# Patient Record
Sex: Female | Born: 1990 | Race: White | Hispanic: No | Marital: Single | Smoking: Current every day smoker
Health system: Southern US, Community
[De-identification: ages and names within clinical notes are randomized; demographics above are authoritative.]

## PROBLEM LIST (undated history)

## (undated) DIAGNOSIS — F1910 Other psychoactive substance abuse, uncomplicated: Secondary | ICD-10-CM

## (undated) DIAGNOSIS — Z87440 Personal history of urinary (tract) infections: Secondary | ICD-10-CM

## (undated) DIAGNOSIS — N61 Mastitis without abscess: Secondary | ICD-10-CM

## (undated) DIAGNOSIS — N289 Disorder of kidney and ureter, unspecified: Secondary | ICD-10-CM

## (undated) DIAGNOSIS — N200 Calculus of kidney: Secondary | ICD-10-CM

## (undated) HISTORY — PX: TOOTH EXTRACTION: SUR596

---
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
## 2011-12-20 ENCOUNTER — Emergency Department (HOSPITAL_COMMUNITY)
Admission: EM | Admit: 2011-12-20 | Discharge: 2011-12-20 | Disposition: A | Discharge status: Home / Self Care | Payer: Medicaid Other | Attending: Emergency Medicine | Admitting: Emergency Medicine

## 2011-12-20 ENCOUNTER — Emergency Department (HOSPITAL_COMMUNITY): Payer: Medicaid Other

## 2011-12-20 ENCOUNTER — Encounter (HOSPITAL_COMMUNITY): Payer: Self-pay | Admitting: Emergency Medicine

## 2011-12-20 DIAGNOSIS — N949 Unspecified condition associated with female genital organs and menstrual cycle: Secondary | ICD-10-CM | POA: Insufficient documentation

## 2011-12-20 DIAGNOSIS — M549 Dorsalgia, unspecified: Secondary | ICD-10-CM | POA: Insufficient documentation

## 2011-12-20 DIAGNOSIS — R109 Unspecified abdominal pain: Secondary | ICD-10-CM | POA: Insufficient documentation

## 2011-12-20 DIAGNOSIS — R112 Nausea with vomiting, unspecified: Secondary | ICD-10-CM | POA: Insufficient documentation

## 2011-12-20 DIAGNOSIS — R3915 Urgency of urination: Secondary | ICD-10-CM | POA: Insufficient documentation

## 2011-12-20 DIAGNOSIS — R300 Dysuria: Secondary | ICD-10-CM | POA: Insufficient documentation

## 2011-12-20 DIAGNOSIS — IMO0002 Reserved for concepts with insufficient information to code with codable children: Secondary | ICD-10-CM | POA: Insufficient documentation

## 2011-12-20 HISTORY — DX: Personal history of urinary (tract) infections: Z87.440

## 2011-12-20 HISTORY — DX: Mastitis without abscess: N61.0

## 2011-12-20 LAB — DIFFERENTIAL
Basophils Absolute: 0 10*3/uL (ref 0.0–0.1)
Basophils Relative: 0 % (ref 0–1)
Eosinophils Relative: 0 % (ref 0–5)
Lymphocytes Relative: 4 % — ABNORMAL LOW (ref 12–46)
Monocytes Absolute: 0.4 10*3/uL (ref 0.1–1.0)

## 2011-12-20 LAB — URINALYSIS, ROUTINE W REFLEX MICROSCOPIC
Bilirubin Urine: NEGATIVE
Ketones, ur: NEGATIVE mg/dL
Leukocytes, UA: NEGATIVE
Nitrite: NEGATIVE
Protein, ur: NEGATIVE mg/dL
Urobilinogen, UA: 0.2 mg/dL (ref 0.0–1.0)
pH: 5.5 (ref 5.0–8.0)

## 2011-12-20 LAB — WET PREP, GENITAL
Trich, Wet Prep: NONE SEEN
WBC, Wet Prep HPF POC: NONE SEEN
Yeast Wet Prep HPF POC: NONE SEEN

## 2011-12-20 LAB — COMPREHENSIVE METABOLIC PANEL
AST: 17 U/L (ref 0–37)
Albumin: 3.8 g/dL (ref 3.5–5.2)
BUN: 10 mg/dL (ref 6–23)
Calcium: 9.3 mg/dL (ref 8.4–10.5)
Creatinine, Ser: 0.6 mg/dL (ref 0.50–1.10)
Total Bilirubin: 0.5 mg/dL (ref 0.3–1.2)
Total Protein: 6.8 g/dL (ref 6.0–8.3)

## 2011-12-20 LAB — CBC
HCT: 41.6 % (ref 36.0–46.0)
MCHC: 33.7 g/dL (ref 30.0–36.0)
MCV: 93.1 fL (ref 78.0–100.0)
Platelets: 251 10*3/uL (ref 150–400)
RDW: 12.5 % (ref 11.5–15.5)
WBC: 13.3 10*3/uL — ABNORMAL HIGH (ref 4.0–10.5)

## 2011-12-20 LAB — PREGNANCY, URINE: Preg Test, Ur: NEGATIVE

## 2011-12-20 MED ORDER — ONDANSETRON HCL 4 MG/2ML IJ SOLN
4.0000 mg | Freq: Once | INTRAMUSCULAR | Status: AC
  Administered 2011-12-20: 4 mg via INTRAVENOUS
  Filled 2011-12-20 (×2): qty 2

## 2011-12-20 MED ORDER — IOHEXOL 300 MG/ML  SOLN
80.0000 mL | Freq: Once | INTRAMUSCULAR | Status: AC | PRN
  Administered 2011-12-20: 80 mL via INTRAVENOUS

## 2011-12-20 MED ORDER — DIPHENHYDRAMINE HCL 50 MG/ML IJ SOLN: 25.0000 mg | Freq: Once | INTRAMUSCULAR | Status: DC

## 2011-12-20 MED ORDER — HYDROCODONE-ACETAMINOPHEN 5-325 MG PO TABS: 1.0000 | ORAL_TABLET | ORAL | Status: AC | PRN

## 2011-12-20 MED ORDER — HYDROMORPHONE HCL PF 1 MG/ML IJ SOLN
1.0000 mg | Freq: Once | INTRAMUSCULAR | Status: AC
  Administered 2011-12-20: 1 mg via INTRAVENOUS
  Filled 2011-12-20: qty 1

## 2011-12-20 NOTE — ED Provider Notes (Signed)
History     CSN: [id number]  Arrival date & time 12/20/11  1721   First MD Initiated Contact with Patient 12/20/11 1836      Chief Complaint  Patient presents with  . Abdominal Pain    (Consider location/radiation/quality/duration/timing/severity/associated sxs/prior treatment) Patient is a 21 y.o. female presenting with abdominal pain. The history is provided by the patient. No language interpreter was used.  Abdominal Pain The primary symptoms of the illness include abdominal pain, nausea, vomiting and dysuria. The primary symptoms of the illness do not include fever, shortness of breath, diarrhea, hematemesis, vaginal discharge or vaginal bleeding. The current episode started more than 2 days ago. The onset of the illness was gradual. The problem has not changed since onset. The dysuria is associated with urgency. The dysuria is not associated with hematuria or frequency.  The patient states that she believes she is currently not pregnant. The patient has not had a change in bowel habit. Additional symptoms associated with the illness include urgency and back pain. Symptoms associated with the illness do not include chills, diaphoresis, constipation, hematuria or frequency.  c/o lower abdominal pain x 3 weeks with dysuria and back pain.   N/v x 2 days.  Denies fever, vaginal bleeding or discharge.  Pain with intercourse.    Past Medical History  Diagnosis Date  . H/O bladder infections   . Mastitis     Past Surgical History  Procedure Date  . Cesarean section     No family history on file.  History  Substance Use Topics  . Smoking status: Current Everyday Smoker  . Smokeless tobacco: Not on file  . Alcohol Use: No    OB History    Grav Para Term Preterm Abortions TAB SAB Ect Mult Living                  Review of Systems  Constitutional: Negative.  Negative for fever, chills and diaphoresis.  HENT: Negative.   Eyes: Negative.   Respiratory: Negative.  Negative  for shortness of breath.   Cardiovascular: Negative.   Gastrointestinal: Positive for nausea, vomiting and abdominal pain. Negative for diarrhea, constipation and hematemesis.  Genitourinary: Positive for dysuria and urgency. Negative for frequency, hematuria, vaginal bleeding, vaginal discharge, enuresis and difficulty urinating.  Musculoskeletal: Positive for back pain.  Neurological: Negative.  Negative for dizziness, weakness and light-headedness.  Psychiatric/Behavioral: Negative.   All other systems reviewed and are negative.    Allergies  Aspirin  Home Medications  No current outpatient prescriptions on file.  BP 115/66  Pulse 121  Temp(Src) 99.5 F (37.5 C) (Oral)  Resp 18  SpO2 98%  LMP 12/11/2011  Physical Exam  Nursing note and vitals reviewed. Constitutional: She is oriented to person, place, and time. She appears well-developed and well-nourished.  HENT:  Head: Normocephalic and atraumatic.  Eyes: Conjunctivae and EOM are normal. Pupils are equal, round, and reactive to light.  Neck: Normal range of motion. Neck supple.  Cardiovascular: Normal rate.   Pulmonary/Chest: Effort normal.  Abdominal: Soft.  Genitourinary: Uterus normal. Cervix exhibits no motion tenderness, no discharge and no friability. Right adnexum displays no mass and no tenderness. Left adnexum displays no mass and no tenderness. There is tenderness around the vagina. No erythema or bleeding around the vagina. No vaginal discharge found.  Musculoskeletal: Normal range of motion. She exhibits no edema and no tenderness.  Neurological: She is alert and oriented to person, place, and time. She has normal reflexes.  Skin:  Skin is warm and dry.  Psychiatric: She has a normal mood and affect.    ED Course  Procedures (including critical care time)  Labs Reviewed  CBC - Abnormal; Notable for the following:    WBC 13.3 (*)    All other components within normal limits  DIFFERENTIAL - Abnormal;  Notable for the following:    Neutrophils Relative 92 (*)    Neutro Abs 12.3 (*)    Lymphocytes Relative 4 (*)    Lymphs Abs 0.6 (*)    All other components within normal limits  COMPREHENSIVE METABOLIC PANEL - Abnormal; Notable for the following:    Potassium 3.3 (*)    Glucose, Bld 111 (*)    All other components within normal limits  URINALYSIS, ROUTINE W REFLEX MICROSCOPIC  PREGNANCY, URINE  WET PREP, GENITAL  GC/CHLAMYDIA PROBE AMP, GENITAL   No results found.   No diagnosis found.    MDM  Report given to Theron Arista PA to dispo patient when CT results come back.  3 weeks of lower abdominal pain with dysuria.  U/a, wet prep and labs unremarkable.  WBC 13.5.  Needs to get pcp or gyn to follow up with this week.    Labs Reviewed  CBC - Abnormal; Notable for the following:    WBC 13.3 (*)    All other components within normal limits  DIFFERENTIAL - Abnormal; Notable for the following:    Neutrophils Relative 92 (*)    Neutro Abs 12.3 (*)    Lymphocytes Relative 4 (*)    Lymphs Abs 0.6 (*)    All other components within normal limits  COMPREHENSIVE METABOLIC PANEL - Abnormal; Notable for the following:    Potassium 3.3 (*)    Glucose, Bld 111 (*)    All other components within normal limits  URINALYSIS, ROUTINE W REFLEX MICROSCOPIC  PREGNANCY, URINE  WET PREP, GENITAL  GC/CHLAMYDIA PROBE AMP, GENITAL  URINE CULTURE         Remi Haggard, NP 12/20/11 2122

## 2011-12-20 NOTE — Discharge Instructions (Signed)
Ms Frogge the pelvic exam did not show any abnormal findings today.  There are still labs pending for gonorrhea and Chlamydia testing.  You will be called if they are positive.  The CT of your abdomen did not show any concerning or emergent findings to explain your pain today. There was no signs of appendicitis or other inflammation or infections. At this time your providers feel you may return home and followup with a primary care provider. Return to emergency room for any worsening pain, fever, chills, persistent nausea vomiting.   Abdominal Pain Abdominal pain can be caused by many things. Your caregiver decides the seriousness of your pain by an examination and possibly blood tests and X-rays. Many cases can be observed and treated at home. Most abdominal pain is not caused by a disease and will probably improve without treatment. However, in many cases, more time must pass before a clear cause of the pain can be found. Before that point, it may not be known if you need more testing, or if hospitalization or surgery is needed. HOME CARE INSTRUCTIONS   Do not take laxatives unless directed by your caregiver.   Take pain medicine only as directed by your caregiver.   Only take over-the-counter or prescription medicines for pain, discomfort, or fever as directed by your caregiver.   Try a clear liquid diet (broth, tea, or water) for as long as directed by your caregiver. Slowly move to a bland diet as tolerated.  SEEK IMMEDIATE MEDICAL CARE IF:   The pain does not go away.   You have a fever.   You keep throwing up (vomiting).   The pain is felt only in portions of the abdomen. Pain in the right side could possibly be appendicitis. In an adult, pain in the left lower portion of the abdomen could be colitis or diverticulitis.   You pass bloody or black tarry stools.  MAKE SURE YOU:   Understand these instructions.   Will watch your condition.   Will get help right away if you are not  doing well or get worse.  Document Released: 04/20/2005 Document Revised: 06/30/2011 Document Reviewed: 02/27/2008 [facility] Patient Information 2012 [place], [place].   RESOURCE GUIDE  Dental Problems  Patients with Medicaid: [facility] [phone] [address]                                           [phone] [address] Phone:  [phone]                                                  Phone:  [phone]  If unable to pay or uninsured, contact:  [facility] or [facility]. to become qualified for the adult dental clinic.  Chronic Pain Problems Contact [facility]  [phone] Patients need to be referred by their primary care doctor.  Insufficient Money for Medicine Contact United Way:  call "211" or [facility] [phone].  No Primary Care Doctor Call [facility]  [phone] Other agencies that provide inexpensive medical care    [facility]  Family Medicine  [phone]    [facility]  [phone]    [facility]  [phone]    [facility]  [phone]    [facility]  [phone]    [facility]  [phone]  Psychological Services [facility]  [phone] [facility]  [phone] [facility] Mental [facility]   [phone] (emergency services [phone])  Substance Abuse Resources [facility]  [phone] [facility] [phone] [phone] [facility] [phone] Floydene Flock ([phone] Residential & Outpatient Substance Abuse Program  [phone]  Abuse/Neglect [facility] Child Abuse Hotline (320)189-0645 [facility] Child Abuse Hotline (352)651-1224 (After Hours)  Emergency Shelter [facility] [phone]  Maternity Homes [facility] [place] [facility] [phone] [facility] [phone]  MRSA Hotline #:   (239)628-3389    [facility]  Resources  [facility]     [employer]                          [facility] Dept. [address] [place]                       [address]      [address]  Blondell Reveal Phone:  [phone]                                   Phone:  [phone]                 Phone:  [phone]  [facility] Phone:  [phone]  [facility] Child Abuse Hotline 5205215313 [phone] (After Hours)

## 2011-12-20 NOTE — ED Notes (Signed)
Pt. With lower right and left quadrant abdominal pain and back pain.  Pain started two weeks ago, but has become progressively worse in the last two days.  Pt. Also having nausea and vomiting the past two days.  Vomited twice.  Pt. Is currently nauseated.  Pt. Has burning with urination and cloudy urine.  History of bladder infections.

## 2011-12-20 NOTE — ED Notes (Signed)
ZOX:[id number]<AV> Expected date:12/20/11<BR> Expected time:<BR> Means of arrival:<BR> Comments:<BR> EMS 61 GC - abd pain

## 2011-12-20 NOTE — ED Provider Notes (Signed)
Jenny Reed S 8:30 PM patient discussed in sign out with Remi Haggard NP. Patient presenting with several weeks of dysuria and abdominal pains. Patient had unremarkable UA and pelvic exam with wet prep. Patient sent for CT for further evaluation. CT pending.   CT scan unremarkable for any concerning or emergent causes of patient's pain. There was small amount of incidental air in the bladder. I discussed this with radiologist. There was no signs for cystovaginal or rectalovaginal fistula. The air may have been a result from the pelvic examination but not thought to be a source of patient's symptoms today.  I discussed findings with patient. Patient is still concerned about her dysuria and urinary problems. Will send urine for culture. Patient also told about pending GC Chlamydia tests.    Angus Seller, [place] 12/20/11 2111

## 2011-12-21 LAB — GC/CHLAMYDIA PROBE AMP, GENITAL: GC Probe Amp, Genital: NEGATIVE

## 2011-12-21 NOTE — ED Provider Notes (Signed)
Medical screening examination/treatment/procedure(s) were performed by non-physician practitioner and as supervising physician I was immediately available for consultation/collaboration.  Aneri Slagel T Jailynn Lavalais, MD 12/21/11 2321 

## 2011-12-21 NOTE — ED Provider Notes (Signed)
Medical screening examination/treatment/procedure(s) were performed by non-physician practitioner and as supervising physician I was immediately available for consultation/collaboration.  Adrianah Prophete T Angelli Baruch, MD 12/21/11 2321 

## 2011-12-23 LAB — URINE CULTURE

## 2011-12-24 NOTE — ED Notes (Signed)
+   Urine Chart sent to EDP office for review. 

## 2011-12-25 NOTE — ED Notes (Signed)
Chart reviewed by Trixie Dredge PA "Give Bactrim DS 1 po BID x 3 days.  F/U w/PCP next week as planned" 6/2 [facility] # message states pt not accepting calls @ this time.

## 2011-12-31 NOTE — ED Notes (Signed)
Sent patient letter after no answer x4.

## 2012-01-03 NOTE — ED Notes (Signed)
Prescription called in to [facility] in [place] at [phone] for bactrim ds one po bid for 3 days, no refills.

## 2013-03-08 ENCOUNTER — Emergency Department (HOSPITAL_COMMUNITY): Payer: Self-pay

## 2013-03-08 ENCOUNTER — Emergency Department (HOSPITAL_COMMUNITY)
Admission: EM | Admit: 2013-03-08 | Discharge: 2013-03-08 | Disposition: A | Discharge status: Home / Self Care | Payer: Self-pay | Attending: Emergency Medicine | Admitting: Emergency Medicine

## 2013-03-08 ENCOUNTER — Encounter (HOSPITAL_COMMUNITY): Payer: Self-pay | Admitting: Emergency Medicine

## 2013-03-08 DIAGNOSIS — T8389XA Other specified complication of genitourinary prosthetic devices, implants and grafts, initial encounter: Secondary | ICD-10-CM | POA: Insufficient documentation

## 2013-03-08 DIAGNOSIS — Z87440 Personal history of urinary (tract) infections: Secondary | ICD-10-CM | POA: Insufficient documentation

## 2013-03-08 DIAGNOSIS — R319 Hematuria, unspecified: Secondary | ICD-10-CM | POA: Insufficient documentation

## 2013-03-08 DIAGNOSIS — N200 Calculus of kidney: Secondary | ICD-10-CM | POA: Insufficient documentation

## 2013-03-08 DIAGNOSIS — R110 Nausea: Secondary | ICD-10-CM | POA: Insufficient documentation

## 2013-03-08 DIAGNOSIS — Z960 Presence of urogenital implants: Secondary | ICD-10-CM

## 2013-03-08 DIAGNOSIS — R300 Dysuria: Secondary | ICD-10-CM | POA: Insufficient documentation

## 2013-03-08 DIAGNOSIS — Y846 Urinary catheterization as the cause of abnormal reaction of the patient, or of later complication, without mention of misadventure at the time of the procedure: Secondary | ICD-10-CM | POA: Insufficient documentation

## 2013-03-08 DIAGNOSIS — F17200 Nicotine dependence, unspecified, uncomplicated: Secondary | ICD-10-CM | POA: Insufficient documentation

## 2013-03-08 DIAGNOSIS — Z3202 Encounter for pregnancy test, result negative: Secondary | ICD-10-CM | POA: Insufficient documentation

## 2013-03-08 DIAGNOSIS — Z87448 Personal history of other diseases of urinary system: Secondary | ICD-10-CM | POA: Insufficient documentation

## 2013-03-08 LAB — COMPREHENSIVE METABOLIC PANEL
ALT: 23 U/L (ref 0–35)
AST: 26 U/L (ref 0–37)
Alkaline Phosphatase: 45 U/L (ref 39–117)
CO2: 25 mEq/L (ref 19–32)
Calcium: 9.1 mg/dL (ref 8.4–10.5)
Potassium: 3.4 mEq/L — ABNORMAL LOW (ref 3.5–5.1)
Sodium: 140 mEq/L (ref 135–145)
Total Protein: 5.8 g/dL — ABNORMAL LOW (ref 6.0–8.3)

## 2013-03-08 LAB — URINALYSIS, ROUTINE W REFLEX MICROSCOPIC
Bilirubin Urine: NEGATIVE
Ketones, ur: NEGATIVE mg/dL
Specific Gravity, Urine: 1.017 (ref 1.005–1.030)
pH: 6.5 (ref 5.0–8.0)

## 2013-03-08 LAB — URINE MICROSCOPIC-ADD ON

## 2013-03-08 LAB — CBC
Hemoglobin: 12.6 g/dL (ref 12.0–15.0)
MCH: 31.5 pg (ref 26.0–34.0)
MCHC: 34.7 g/dL (ref 30.0–36.0)
Platelets: 361 10*3/uL (ref 150–400)
RBC: 4 MIL/uL (ref 3.87–5.11)

## 2013-03-08 MED ORDER — NAPROXEN 500 MG PO TABS: 500.0000 mg | ORAL_TABLET | Freq: Two times a day (BID) | ORAL | Status: DC

## 2013-03-08 MED ORDER — ONDANSETRON HCL 4 MG PO TABS: 4.0000 mg | ORAL_TABLET | Freq: Four times a day (QID) | ORAL | Status: DC

## 2013-03-08 MED ORDER — MORPHINE SULFATE 4 MG/ML IJ SOLN
4.0000 mg | Freq: Once | INTRAMUSCULAR | Status: AC
  Administered 2013-03-08: 4 mg via INTRAVENOUS
  Filled 2013-03-08: qty 1

## 2013-03-08 MED ORDER — ONDANSETRON HCL 4 MG/2ML IJ SOLN
4.0000 mg | Freq: Once | INTRAMUSCULAR | Status: AC
  Administered 2013-03-08: 4 mg via INTRAVENOUS
  Filled 2013-03-08: qty 2

## 2013-03-08 MED ORDER — OXYCODONE-ACETAMINOPHEN 5-325 MG PO TABS: 1.0000 | ORAL_TABLET | ORAL | Status: DC | PRN

## 2013-03-08 MED ORDER — CIPROFLOXACIN IN D5W 400 MG/200ML IV SOLN
400.0000 mg | Freq: Once | INTRAVENOUS | Status: AC
  Administered 2013-03-08: 400 mg via INTRAVENOUS
  Filled 2013-03-08: qty 200

## 2013-03-08 NOTE — ED Notes (Addendum)
Pt c/o right lower abdomen pain. Pt was dx with kidney stone on 02/24/13 and had a stent placed. About 2 days after stent was placed pt has been having increased pain, went back to ED and they were unable to remove stent at the time when she went and was sent home with rx for ABT and nausea medication. Pt was unable to fill med.

## 2013-03-08 NOTE — ED Provider Notes (Signed)
CSN: [id number]     Arrival date & time 03/08/13  4098 History     First MD Initiated Contact with Patient 03/08/13 0940     Chief Complaint  Patient presents with  . Abdominal Pain   (Consider location/radiation/quality/duration/timing/severity/associated sxs/prior Treatment) HPI Comments: 22 year old white female presents emergency department with chief complaint of right flank pain. Patient initially presented to [facility] in [place] for right flank pain and was diagnosed with a kidney stone. Her past medical records were reviewed and revealed she had ESWL and right ureteral stent placed on 02/24/2013. Per the patient and her mother she was supposed to have the stent removed in one week however she did not have Medicaid in [place] and was told she had to pay for this procedure and opted not to have it done. She was seen in emergency department insert soda on August 12 was complaining nausea and vomiting and was discharged with prescription for Cipro and Phenergan which she did not fill. The patient's mother went to [place] and brought her daughter home. She was recently seen at [facility] in [place] one day ago on 03/07/2013.  Mother and daughter report that "the left the emergency department because they had to wait too long and nothing was being done"..  Patient's current symptoms include right flank pain. The patient and her mother requesting removal of the right ureteral stent. Of note the patient states she can no longer find the strings for the stent. Patient denies fever, chills, chest pain, shortness of breath, vomiting, or vaginal symptoms. She does report right flank and right abdominal pain and nausea without vomiting. She states her last menstrual period was one month ago and denies pregnancy. She still has hematuria and dysuria.  Patient is a 22 y.o. female presenting with flank pain.  Flank Pain This is a chronic problem. The current episode started more than  1 week ago. The problem occurs constantly. The problem has not changed since onset.Associated symptoms include abdominal pain. Pertinent negatives include no chest pain, no headaches and no shortness of breath. The symptoms are aggravated by bending and twisting. Nothing relieves the symptoms. She has tried nothing for the symptoms.    Past Medical History  Diagnosis Date  . H/O bladder infections   . Mastitis    Past Surgical History  Procedure Laterality Date  . Cesarean section     No family history on file. History  Substance Use Topics  . Smoking status: Current Every Day Smoker  . Smokeless tobacco: Not on file  . Alcohol Use: No   OB History   Grav Para Term Preterm Abortions TAB SAB Ect Mult Living                 Review of Systems  Constitutional: Negative.   HENT: Negative.   Eyes: Negative.   Respiratory: Negative.  Negative for shortness of breath.   Cardiovascular: Negative for chest pain.  Gastrointestinal: Positive for abdominal pain.  Endocrine: Negative.   Genitourinary: Positive for dysuria, hematuria and flank pain. Negative for frequency, enuresis, difficulty urinating, genital sores, menstrual problem, pelvic pain and dyspareunia.  Neurological: Negative.  Negative for dizziness, facial asymmetry and headaches.    Allergies  Aspirin and Bactrim  Home Medications   Current Outpatient Rx  Name  Route  Sig  Dispense  Refill  . HYDROcodone-acetaminophen (NORCO/VICODIN) 5-325 MG per tablet   Oral   Take 1 tablet by mouth every 4 (four) hours as needed for pain.         Marland Kitchen  oxyCODONE-acetaminophen (PERCOCET) 10-325 MG per tablet   Oral   Take 1 tablet by mouth every 4 (four) hours as needed for pain.         . naproxen (NAPROSYN) 500 MG tablet   Oral   Take 1 tablet (500 mg total) by mouth 2 (two) times daily.   30 tablet   0   . ondansetron (ZOFRAN) 4 MG tablet   Oral   Take 1 tablet (4 mg total) by mouth every 6 (six) hours.   12 tablet    0   . oxyCODONE-acetaminophen (PERCOCET/ROXICET) 5-325 MG per tablet   Oral   Take 1 tablet by mouth every 4 (four) hours as needed for pain.   20 tablet   0    BP 111/74  Pulse 78  Temp(Src) 98.1 F (36.7 C) (Oral)  SpO2 99%  LMP 02/13/2013 Physical Exam  Constitutional: She appears well-developed and well-nourished. No distress.  HENT:  Head: Normocephalic and atraumatic.  Eyes: Conjunctivae and EOM are normal. Pupils are equal, round, and reactive to light.  Neck: Normal range of motion. Neck supple.  Cardiovascular: Normal rate, regular rhythm and normal heart sounds.   Pulmonary/Chest: Effort normal and breath sounds normal.  Abdominal: Soft. She exhibits no mass. There is tenderness. There is no rebound and no guarding.  Right flank tenderness to palpation and CVA tenderness present; abdomen otherwise soft, no guarding rigidity or masses, no hepatosplenomegaly  Genitourinary:       ED Course   Procedures (including critical care time)  Labs Reviewed  COMPREHENSIVE METABOLIC PANEL - Abnormal; Notable for the following:    Potassium 3.4 (*)    Total Protein 5.8 (*)    Albumin 3.4 (*)    All other components within normal limits  URINALYSIS, ROUTINE W REFLEX MICROSCOPIC - Abnormal; Notable for the following:    Color, Urine RED (*)    APPearance TURBID (*)    Hgb urine dipstick LARGE (*)    Protein, ur >300 (*)    Leukocytes, UA LARGE (*)    All other components within normal limits  URINE MICROSCOPIC-ADD ON - Abnormal; Notable for the following:    Squamous Epithelial / LPF MANY (*)    Bacteria, UA FEW (*)    All other components within normal limits  URINE CULTURE  CBC  PREGNANCY, URINE   Dg Abd 1 View  03/08/2013   *RADIOLOGY REPORT*  Clinical Data: evaluate position of right ureteral stent, placed two weeks ago for kidney stone  ABDOMEN - 1 VIEW  Comparison: None.  Findings: Double loop right ureteral stent present, with proximal loop to the right of  the L2/3 disk space and distal loop over the anticipated position of the bladder.  Normal bowel gas pattern.  IMPRESSION: Ureteral stent as described, with proximal loop over the anticipated region of the right ureteropelvic junction and distal loop over the anticipated position of the bladder.   Original Report Authenticated By: Esperanza Heir, M.D.   1. Right kidney stone   2. Ureteral stent retained     Results for orders placed during the hospital encounter of 03/08/13  CBC      Result Value Range   WBC 10.1  4.0 - 10.5 K/uL   RBC 4.00  3.87 - 5.11 MIL/uL   Hemoglobin 12.6  12.0 - 15.0 g/dL   HCT 95.6  21.3 - 08.6 %   MCV 90.8  78.0 - 100.0 fL   MCH 31.5  26.0 -  34.0 pg   MCHC 34.7  30.0 - 36.0 g/dL   RDW 09.8  11.9 - 14.7 %   Platelets 361  150 - 400 K/uL  COMPREHENSIVE METABOLIC PANEL      Result Value Range   Sodium 140  135 - 145 mEq/L   Potassium 3.4 (*) 3.5 - 5.1 mEq/L   Chloride 106  96 - 112 mEq/L   CO2 25  19 - 32 mEq/L   Glucose, Bld 86  70 - 99 mg/dL   BUN 10  6 - 23 mg/dL   Creatinine, Ser 8.29  0.50 - 1.10 mg/dL   Calcium 9.1  8.4 - 56.2 mg/dL   Total Protein 5.8 (*) 6.0 - 8.3 g/dL   Albumin 3.4 (*) 3.5 - 5.2 g/dL   AST 26  0 - 37 U/L   ALT 23  0 - 35 U/L   Alkaline Phosphatase 45  39 - 117 U/L   Total Bilirubin 0.5  0.3 - 1.2 mg/dL   GFR calc non Af Amer >90  >90 mL/min   GFR calc Af Amer >90  >90 mL/min  URINALYSIS, ROUTINE W REFLEX MICROSCOPIC      Result Value Range   Color, Urine RED (*) YELLOW   APPearance TURBID (*) CLEAR   Specific Gravity, Urine 1.017  1.005 - 1.030   pH 6.5  5.0 - 8.0   Glucose, UA NEGATIVE  NEGATIVE mg/dL   Hgb urine dipstick LARGE (*) NEGATIVE   Bilirubin Urine NEGATIVE  NEGATIVE   Ketones, ur NEGATIVE  NEGATIVE mg/dL   Protein, ur >130 (*) NEGATIVE mg/dL   Urobilinogen, UA 1.0  0.0 - 1.0 mg/dL   Nitrite NEGATIVE  NEGATIVE   Leukocytes, UA LARGE (*) NEGATIVE  PREGNANCY, URINE      Result Value Range   Preg Test, Ur  NEGATIVE  NEGATIVE  URINE MICROSCOPIC-ADD ON      Result Value Range   Squamous Epithelial / LPF MANY (*) RARE   WBC, UA 3-6  <3 WBC/hpf   RBC / HPF TOO NUMEROUS TO COUNT  <3 RBC/hpf   Bacteria, UA FEW (*) RARE    EDC: VSS, labs ok, no evidence or UTI or SBI, symptoms managed in ER.    MDM  22 year old white female with history of right-sided kidney stone status post ESWL and ureteral stent placement on 02/24/2013. Patient has had minimal followup and poor compliance with her suggested plan. Per report the stent was supposed to be removed 7 days after placement but still remains in place. She has not been able to pay for her medications nor removal of the stent. Today she is in minimal distress, her vital signs are normal, she is afebrile.  Plan to obtain urine, blood, KUB plain film. Upon obtaining data I will discuss case with urology.  Case d/w Urology, Dr. Crecencio Mc who agrees with outpt follow up in the urology clinic.  Pt is to call for f/u.    Darlys Gales, MD 03/08/13 [phone]

## 2013-03-09 LAB — URINE CULTURE

## 2013-03-14 ENCOUNTER — Encounter (HOSPITAL_COMMUNITY): Payer: Self-pay | Admitting: *Deleted

## 2013-03-14 ENCOUNTER — Emergency Department (HOSPITAL_COMMUNITY)
Admission: EM | Admit: 2013-03-14 | Discharge: 2013-03-14 | Disposition: A | Discharge status: Home / Self Care | Payer: Self-pay | Attending: Emergency Medicine | Admitting: Emergency Medicine

## 2013-03-14 DIAGNOSIS — R319 Hematuria, unspecified: Secondary | ICD-10-CM | POA: Insufficient documentation

## 2013-03-14 DIAGNOSIS — F17200 Nicotine dependence, unspecified, uncomplicated: Secondary | ICD-10-CM | POA: Insufficient documentation

## 2013-03-14 DIAGNOSIS — Z466 Encounter for fitting and adjustment of urinary device: Secondary | ICD-10-CM | POA: Insufficient documentation

## 2013-03-14 DIAGNOSIS — Z3202 Encounter for pregnancy test, result negative: Secondary | ICD-10-CM | POA: Insufficient documentation

## 2013-03-14 DIAGNOSIS — R630 Anorexia: Secondary | ICD-10-CM | POA: Insufficient documentation

## 2013-03-14 DIAGNOSIS — Z87440 Personal history of urinary (tract) infections: Secondary | ICD-10-CM | POA: Insufficient documentation

## 2013-03-14 DIAGNOSIS — M545 Low back pain, unspecified: Secondary | ICD-10-CM | POA: Insufficient documentation

## 2013-03-14 DIAGNOSIS — Z87448 Personal history of other diseases of urinary system: Secondary | ICD-10-CM | POA: Insufficient documentation

## 2013-03-14 DIAGNOSIS — Z87442 Personal history of urinary calculi: Secondary | ICD-10-CM | POA: Insufficient documentation

## 2013-03-14 DIAGNOSIS — R300 Dysuria: Secondary | ICD-10-CM | POA: Insufficient documentation

## 2013-03-14 DIAGNOSIS — R112 Nausea with vomiting, unspecified: Secondary | ICD-10-CM | POA: Insufficient documentation

## 2013-03-14 DIAGNOSIS — R000 Tachycardia, unspecified: Secondary | ICD-10-CM | POA: Insufficient documentation

## 2013-03-14 DIAGNOSIS — Z960 Presence of urogenital implants: Secondary | ICD-10-CM

## 2013-03-14 DIAGNOSIS — R3915 Urgency of urination: Secondary | ICD-10-CM | POA: Insufficient documentation

## 2013-03-14 HISTORY — DX: Disorder of kidney and ureter, unspecified: N28.9

## 2013-03-14 LAB — URINALYSIS, ROUTINE W REFLEX MICROSCOPIC
Nitrite: NEGATIVE
Specific Gravity, Urine: 1.02 (ref 1.005–1.030)
pH: 6.5 (ref 5.0–8.0)

## 2013-03-14 LAB — CBC WITH DIFFERENTIAL/PLATELET
Basophils Absolute: 0 10*3/uL (ref 0.0–0.1)
Basophils Relative: 0 % (ref 0–1)
Eosinophils Absolute: 0.1 10*3/uL (ref 0.0–0.7)
Eosinophils Relative: 1 % (ref 0–5)
Lymphocytes Relative: 23 % (ref 12–46)
MCH: 32.2 pg (ref 26.0–34.0)
MCHC: 34.8 g/dL (ref 30.0–36.0)
MCV: 92.3 fL (ref 78.0–100.0)
Platelets: 448 10*3/uL — ABNORMAL HIGH (ref 150–400)
RDW: 13.4 % (ref 11.5–15.5)
WBC: 7.9 10*3/uL (ref 4.0–10.5)

## 2013-03-14 LAB — URINE MICROSCOPIC-ADD ON

## 2013-03-14 LAB — COMPREHENSIVE METABOLIC PANEL
ALT: 26 U/L (ref 0–35)
AST: 42 U/L — ABNORMAL HIGH (ref 0–37)
Calcium: 10.1 mg/dL (ref 8.4–10.5)
Sodium: 138 mEq/L (ref 135–145)
Total Protein: 7.2 g/dL (ref 6.0–8.3)

## 2013-03-14 MED ORDER — ONDANSETRON HCL 4 MG/2ML IJ SOLN
4.0000 mg | Freq: Once | INTRAMUSCULAR | Status: AC
  Administered 2013-03-14: 4 mg via INTRAVENOUS
  Filled 2013-03-14: qty 2

## 2013-03-14 MED ORDER — HYDROMORPHONE HCL PF 1 MG/ML IJ SOLN
1.0000 mg | Freq: Once | INTRAMUSCULAR | Status: AC
  Administered 2013-03-14: 1 mg via INTRAVENOUS
  Filled 2013-03-14: qty 1

## 2013-03-14 MED ORDER — PROMETHAZINE HCL 25 MG PO TABS: 25.0000 mg | ORAL_TABLET | Freq: Four times a day (QID) | ORAL | Status: DC | PRN

## 2013-03-14 MED ORDER — DEXTROSE 5 % IV SOLN
1.0000 g | Freq: Once | INTRAVENOUS | Status: AC
  Administered 2013-03-14: 1 g via INTRAVENOUS
  Filled 2013-03-14: qty 10

## 2013-03-14 MED ORDER — OXYCODONE-ACETAMINOPHEN 5-325 MG PO TABS: 1.0000 | ORAL_TABLET | ORAL | Status: DC | PRN

## 2013-03-14 MED ORDER — SODIUM CHLORIDE 0.9 % IV BOLUS (SEPSIS)
1000.0000 mL | Freq: Once | INTRAVENOUS | Status: AC
  Administered 2013-03-14: 1000 mL via INTRAVENOUS

## 2013-03-14 NOTE — ED Notes (Signed)
Per pt and family, has hx of kidney stone and had renal stent placed, having increase in abd pain and has been seen here for same and told that she needs to have stent removed but has been unable to schedule a followup with [facility].

## 2013-03-14 NOTE — ED Notes (Signed)
Plan of care is updated with verbal understanding. Pt reports decrease in pain at this time and will continue to monitor pt.

## 2013-03-14 NOTE — ED Provider Notes (Signed)
CSN: [id number]     Arrival date & time 03/14/13  1704 History     First MD Initiated Contact with Patient 03/14/13 1841     Chief Complaint  Patient presents with  . Abdominal Pain   (Consider location/radiation/quality/duration/timing/severity/associated sxs/prior Treatment) Patient is a 22 y.o. female presenting with abdominal pain. The history is provided by the patient and medical records. No language interpreter was used.  Abdominal Pain Associated symptoms: dysuria   Associated symptoms: no chest pain, no constipation, no cough, no diarrhea, no fatigue, no fever, no hematuria, no nausea, no shortness of breath and no vomiting     LOUANNA VANLIEW is a 22 y.o. female  with a hx of bladder infections and kidney stones presents to the Emergency Department complaining of persistent, unrelenting suprapubic and low back pain.  Patient initially presented to the [facility], diagnosed with kidney stone and had a right ureteral stent placed on 02/24/2013. She was supposed to have the stent removed in one week however because she did not have Medicaid it was not done. Patient presented back to the emergency department on 03/05/2013 were she was prescribed Cipro and Phenergan which she did not fill. Patient then came home to [place] on 03/06/2013 and was seen at [facility] on 03/07/2013. Patient was evaluated here in the emergency department on 03/08/2013 and was not found to have a urinary tract infection. Dr. Laverle Patter was consult and he recommended outpatient followup with the removal. Patient was given Cipro here in the emergency department and discharged home with pain medications.  She presents today with continuation of her suprapubic and low back pain. Mother states patient has been afebrile but has had decreased appetite nausea and vomiting. She also endorses significant dysuria and hematuria. Patient reports that the pain medication did not help her pain and that she could  not afford to fill her nausea medicine. Pt denies chest pain, shortness of breath, diarrhea, weakness, dizziness, syncope.     Past Medical History  Diagnosis Date  . H/O bladder infections   . Mastitis   . Renal disorder    Past Surgical History  Procedure Laterality Date  . Cesarean section     History reviewed. No pertinent family history. History  Substance Use Topics  . Smoking status: Current Every Day Smoker  . Smokeless tobacco: Not on file  . Alcohol Use: No   OB History   Grav Para Term Preterm Abortions TAB SAB Ect Mult Living                 Review of Systems  Constitutional: Positive for appetite change. Negative for fever, diaphoresis, fatigue and unexpected weight change.  HENT: Negative for mouth sores and neck stiffness.   Eyes: Negative for visual disturbance.  Respiratory: Negative for cough, chest tightness, shortness of breath and wheezing.   Cardiovascular: Negative for chest pain.  Gastrointestinal: Positive for abdominal pain. Negative for nausea, vomiting, diarrhea and constipation.  Endocrine: Negative for polydipsia, polyphagia and polyuria.  Genitourinary: Positive for dysuria, urgency and flank pain. Negative for frequency and hematuria.  Musculoskeletal: Negative for back pain.  Skin: Negative for rash.  Allergic/Immunologic: Negative for immunocompromised state.  Neurological: Negative for syncope, light-headedness and headaches.  Hematological: Does not bruise/bleed easily.  Psychiatric/Behavioral: Negative for sleep disturbance. The patient is not nervous/anxious.     Allergies  Aspirin and Bactrim  Home Medications   Current Outpatient Rx  Name  Route  Sig  Dispense  Refill  .  Doxylamine Succinate, Sleep, (SLEEP AID PO)   Oral   Take 1 tablet by mouth at bedtime as needed (for sleep).         . naproxen (NAPROSYN) 500 MG tablet   Oral   Take 1 tablet (500 mg total) by mouth 2 (two) times daily.   30 tablet   0    BP  134/85  Pulse 101  Temp(Src) 98.6 F (37 C) (Oral)  Resp 18  SpO2 98%  LMP 02/13/2013 Physical Exam  Nursing note and vitals reviewed. Constitutional: She is oriented to person, place, and time. She appears well-developed and well-nourished.  Is alert, awake, nontoxic, nonseptic appearing  HENT:  Head: Normocephalic and atraumatic.  Mouth/Throat: Oropharynx is clear and moist.  Eyes: Conjunctivae are normal. Pupils are equal, round, and reactive to light. No scleral icterus.  Neck: Normal range of motion.  Cardiovascular: Regular rhythm, S1 normal, S2 normal, normal heart sounds and intact distal pulses.  Tachycardia present.   No murmur heard. Pulses:      Radial pulses are 2+ on the right side, and 2+ on the left side.       Dorsalis pedis pulses are 2+ on the right side, and 2+ on the left side.       Posterior tibial pulses are 2+ on the right side, and 2+ on the left side.  Mild Tachycardia  Pulmonary/Chest: Effort normal and breath sounds normal. No respiratory distress. She has no wheezes.  Abdominal: Soft. Bowel sounds are normal. She exhibits no distension and no mass. There is tenderness in the suprapubic area. There is CVA tenderness (bilateral). There is no rebound and no guarding.  Patient with suprapubic pain to palpation without guarding  Lymphadenopathy:    She has no cervical adenopathy.  Neurological: She is alert and oriented to person, place, and time. She exhibits normal muscle tone. Coordination normal.  Skin: Skin is warm and dry. No rash noted. No erythema.  Psychiatric: She has a normal mood and affect.  Patient tearful    ED Course   Procedures (including critical care time)  Labs Reviewed  CBC WITH DIFFERENTIAL - Abnormal; Notable for the following:    Platelets 448 (*)    All other components within normal limits  COMPREHENSIVE METABOLIC PANEL - Abnormal; Notable for the following:    AST 42 (*)    All other components within normal limits   URINALYSIS, ROUTINE W REFLEX MICROSCOPIC - Abnormal; Notable for the following:    Color, Urine AMBER (*)    APPearance CLOUDY (*)    Hgb urine dipstick LARGE (*)    Bilirubin Urine SMALL (*)    Ketones, ur >80 (*)    Protein, ur >300 (*)    Urobilinogen, UA 2.0 (*)    Leukocytes, UA LARGE (*)    All other components within normal limits  URINE MICROSCOPIC-ADD ON - Abnormal; Notable for the following:    Squamous Epithelial / LPF FEW (*)    Bacteria, UA FEW (*)    All other components within normal limits  URINE CULTURE  POCT PREGNANCY, URINE   No results found. 1. Ureteral stent retained     MDM  Judee Clara presents with persistent suprapubic and low back pain post EWSL and ureteral stent placement 02/24/2013. Patient has not had any follow-up; has not had the stent removed in his been poorly compliant with plan of action in her medications.  Patient relates that she has not been seen by  [facility] because they need $150 to be seen.  Record review shows that exam on 03/08/2013 showed no strings at the urethra.  Will give fluids, pain control and nausea control here in the emergency department.  Discussed with Dr. Vernie Ammons who states he would not give antibiotics if the patient is not febrile with a leukocytosis. He also states that she must followup in the office for removal of the stent. He specifically states he will not come to the emergency department to remove her stent.  Discussed with patient and family.  She will receive 2 L of fluid here along with Rocephin, pain and nausea control. She is to followup with urology this week.  Discussed with Kyung Bacca, PA-C who will follow and discharge home.    Dr. Loren Racer was consulted, evaluated this patient with me and agrees with the plan.     Dahlia Client Fran Mcree, PA-C 03/14/13 2011

## 2013-03-14 NOTE — ED Notes (Signed)
Pt denies any questions upon discharge. 

## 2013-03-15 LAB — URINE CULTURE
Colony Count: NO GROWTH
Culture: NO GROWTH

## 2013-03-17 NOTE — ED Provider Notes (Signed)
Medical screening examination/treatment/procedure(s) were conducted as a shared visit with non-physician practitioner(s) and myself.  I personally evaluated the patient during the encounter Pt with retained ureteral stent. Discussed findings with Urology. No abx recommended. Outpatient follow up for removal. Conveyed to pt and mother. Will f/u  Loren Racer, MD 03/17/13 [phone]

## 2013-06-21 ENCOUNTER — Emergency Department (HOSPITAL_COMMUNITY)
Admission: EM | Admit: 2013-06-21 | Discharge: 2013-06-21 | Disposition: A | Discharge status: Home / Self Care | Payer: Medicaid Other | Attending: Emergency Medicine | Admitting: Emergency Medicine

## 2013-06-21 ENCOUNTER — Encounter (HOSPITAL_COMMUNITY): Payer: Self-pay | Admitting: Emergency Medicine

## 2013-06-21 DIAGNOSIS — Z87448 Personal history of other diseases of urinary system: Secondary | ICD-10-CM | POA: Insufficient documentation

## 2013-06-21 DIAGNOSIS — F17200 Nicotine dependence, unspecified, uncomplicated: Secondary | ICD-10-CM | POA: Insufficient documentation

## 2013-06-21 DIAGNOSIS — K089 Disorder of teeth and supporting structures, unspecified: Secondary | ICD-10-CM | POA: Insufficient documentation

## 2013-06-21 DIAGNOSIS — Z8742 Personal history of other diseases of the female genital tract: Secondary | ICD-10-CM | POA: Insufficient documentation

## 2013-06-21 DIAGNOSIS — K029 Dental caries, unspecified: Secondary | ICD-10-CM | POA: Insufficient documentation

## 2013-06-21 DIAGNOSIS — K0889 Other specified disorders of teeth and supporting structures: Secondary | ICD-10-CM

## 2013-06-21 MED ORDER — IBUPROFEN 800 MG PO TABS: 800.0000 mg | ORAL_TABLET | Freq: Three times a day (TID) | ORAL | Status: DC

## 2013-06-21 NOTE — ED Provider Notes (Signed)
CSN: [id number]     Arrival date & time 06/21/13  1322 History  This chart was scribed for non-physician practitioner Sharilyn Sites, PA-C working with Flint Melter, MD by Danella Maiers, ED Scribe. This patient was seen in room TR09C/TR09C and the patient's care was started at 1:38 PM.   Chief Complaint  Patient presents with  . Dental Pain   The history is provided by the patient. No language interpreter was used.   HPI Comments: Jenny Reed is a 22 y.o. female who presents to the Emergency Department complaining of constant right lower tooth pain since a piece of her tooth broke off yesterday while she was eating candy.  Denies any numbness or paresthesias of face.  Pt not currently established with a dentist but states she has had that tooth filled in the past.   Past Medical History  Diagnosis Date  . H/O bladder infections   . Mastitis   . Renal disorder    Past Surgical History  Procedure Laterality Date  . Cesarean section     History reviewed. No pertinent family history. History  Substance Use Topics  . Smoking status: Current Every Day Smoker  . Smokeless tobacco: Not on file  . Alcohol Use: Yes     Comment: occ   OB History   Grav Para Term Preterm Abortions TAB SAB Ect Mult Living                 Review of Systems  HENT: Positive for dental problem.   All other systems reviewed and are negative.    Allergies  Aspirin and Bactrim  Home Medications   Current Outpatient Rx  Name  Route  Sig  Dispense  Refill  . Doxylamine Succinate, Sleep, (SLEEP AID PO)   Oral   Take 1 tablet by mouth at bedtime as needed (for sleep).         . naproxen (NAPROSYN) 500 MG tablet   Oral   Take 1 tablet (500 mg total) by mouth 2 (two) times daily.   30 tablet   0   . oxyCODONE-acetaminophen (PERCOCET/ROXICET) 5-325 MG per tablet   Oral   Take 1-2 tablets by mouth every 4 (four) hours as needed for pain.   30 tablet   0   . promethazine (PHENERGAN) 25  MG tablet   Oral   Take 1 tablet (25 mg total) by mouth every 6 (six) hours as needed for nausea.   30 tablet   0    BP 127/82  Pulse 80  Temp(Src) 96.9 F (36.1 C) (Oral)  Resp 18  SpO2 100% Physical Exam  Nursing note and vitals reviewed. Constitutional: She is oriented to person, place, and time. She appears well-developed and well-nourished. No distress.  HENT:  Head: Normocephalic and atraumatic.  Mouth/Throat: Uvula is midline, oropharynx is clear and moist and mucous membranes are normal. Abnormal dentition. Dental caries present. No dental abscesses.    Teeth largely in poor dentition, right lower molar broken, surrounding gingiva normal in appearance; handling secretions appropriately, no trismus  Eyes: EOM are normal.  Neck: Neck supple. No tracheal deviation present.  Cardiovascular: Normal rate.   Pulmonary/Chest: Effort normal. No respiratory distress.  Musculoskeletal: Normal range of motion.  Neurological: She is alert and oriented to person, place, and time.  Skin: Skin is warm and dry.  Psychiatric: She has a normal mood and affect. Her behavior is normal.    ED Course  Procedures (including critical care  time) Medications - No data to display  DIAGNOSTIC STUDIES: Oxygen Saturation is 100% on RA, normal by my interpretation.    COORDINATION OF CARE: 1:52 PM- Discussed treatment plan with pt which includes dentist referral. Pt agrees to plan.   MDM   1. Pain, dental     Dental pain due to broken tooth without signs of abscess. Rx motrin.  FU with dentist-- referral given. Discussed plan with pt, they agreed.  Return precautions advised.  I personally performed the services described in this documentation, which was scribed in my presence. The recorded information has been reviewed and is accurate.  Garlon Hatchet, PA-C 06/21/13 1430

## 2013-06-21 NOTE — ED Provider Notes (Signed)
Medical screening examination/treatment/procedure(s) were performed by non-physician practitioner and as supervising physician I was immediately available for consultation/collaboration.  Torrance Stockley L Biagio Snelson, MD 06/21/13 1703 

## 2013-06-21 NOTE — ED Notes (Signed)
Pt c/o right lower tooth pain after breaking piece of tooth of yesterday

## 2013-06-27 ENCOUNTER — Encounter (HOSPITAL_COMMUNITY): Payer: Self-pay | Admitting: Emergency Medicine

## 2013-06-27 ENCOUNTER — Emergency Department (HOSPITAL_COMMUNITY)
Admission: EM | Admit: 2013-06-27 | Discharge: 2013-06-27 | Disposition: A | Discharge status: Home / Self Care | Payer: Medicaid Other | Attending: Emergency Medicine | Admitting: Emergency Medicine

## 2013-06-27 DIAGNOSIS — F17200 Nicotine dependence, unspecified, uncomplicated: Secondary | ICD-10-CM | POA: Insufficient documentation

## 2013-06-27 DIAGNOSIS — K029 Dental caries, unspecified: Secondary | ICD-10-CM | POA: Insufficient documentation

## 2013-06-27 DIAGNOSIS — Z791 Long term (current) use of non-steroidal anti-inflammatories (NSAID): Secondary | ICD-10-CM | POA: Insufficient documentation

## 2013-06-27 DIAGNOSIS — Z87448 Personal history of other diseases of urinary system: Secondary | ICD-10-CM | POA: Insufficient documentation

## 2013-06-27 DIAGNOSIS — Z8742 Personal history of other diseases of the female genital tract: Secondary | ICD-10-CM | POA: Insufficient documentation

## 2013-06-27 DIAGNOSIS — K0889 Other specified disorders of teeth and supporting structures: Secondary | ICD-10-CM

## 2013-06-27 DIAGNOSIS — K089 Disorder of teeth and supporting structures, unspecified: Secondary | ICD-10-CM | POA: Insufficient documentation

## 2013-06-27 MED ORDER — LIDOCAINE HCL 2 % IJ SOLN: 20.0000 mL | Freq: Once | INTRAMUSCULAR | Status: DC

## 2013-06-27 MED ORDER — OXYCODONE-ACETAMINOPHEN 5-325 MG PO TABS: 1.0000 | ORAL_TABLET | Freq: Four times a day (QID) | ORAL | Status: DC | PRN

## 2013-06-27 MED ORDER — OXYCODONE-ACETAMINOPHEN 5-325 MG PO TABS
1.0000 | ORAL_TABLET | Freq: Once | ORAL | Status: AC
  Administered 2013-06-27: 1 via ORAL
  Filled 2013-06-27: qty 1

## 2013-06-27 MED ORDER — BUPIVACAINE-EPINEPHRINE PF 0.25-1:200000 % IJ SOLN: 1.8000 mL | Freq: Once | INTRAMUSCULAR | Status: DC

## 2013-06-27 NOTE — ED Notes (Signed)
Pt had R lower tooth extraction on Monday and states she did not receive any pain medication.  Pt crying in pain at this time.

## 2013-06-27 NOTE — ED Provider Notes (Signed)
Medical screening examination/treatment/procedure(s) were performed by non-physician practitioner and as supervising physician I was immediately available for consultation/collaboration.   Genisis Sonnier, MD 06/27/13 0646 

## 2013-06-27 NOTE — ED Provider Notes (Signed)
CSN: [id number]     Arrival date & time 06/27/13  0144 History   First MD Initiated Contact with Patient 06/27/13 0154     Chief Complaint  Patient presents with  . Dental Pain   (Consider location/radiation/quality/duration/timing/severity/associated sxs/prior Treatment) HPI Comments: Patient presents with complaint of lower right sided dental pain in an area of a dental extraction she had performed 2 days ago. Patient has been taking ibuprofen without relief. She denies facial swelling, fever, difficulty swallowing. Pain is severe. Onset of symptoms gradual. Course is constant. Nothing makes symptoms better or worse.  The history is provided by the patient.    Past Medical History  Diagnosis Date  . H/O bladder infections   . Mastitis   . Renal disorder    Past Surgical History  Procedure Laterality Date  . Cesarean section    . Tooth extraction     No family history on file. History  Substance Use Topics  . Smoking status: Current Every Day Smoker  . Smokeless tobacco: Not on file  . Alcohol Use: Yes     Comment: occ   OB History   Grav Para Term Preterm Abortions TAB SAB Ect Mult Living                 Review of Systems  Constitutional: Negative for fever.  HENT: Positive for dental problem. Negative for ear pain, facial swelling, sore throat and trouble swallowing.   Respiratory: Negative for shortness of breath and stridor.   Musculoskeletal: Negative for neck pain.  Skin: Negative for color change.  Neurological: Negative for headaches.    Allergies  Aspirin and Bactrim  Home Medications   Current Outpatient Rx  Name  Route  Sig  Dispense  Refill  . ibuprofen (ADVIL,MOTRIN) 800 MG tablet   Oral   Take 1 tablet (800 mg total) by mouth 3 (three) times daily.   21 tablet   0    BP 123/105  Pulse 142  Temp(Src) 98.2 F (36.8 C) (Oral)  Resp 18  SpO2 98%  LMP 06/16/2013 Physical Exam  Nursing note and vitals reviewed. Constitutional: She appears  well-developed and well-nourished. She appears distressed.  Patient crying.   HENT:  Head: Normocephalic and atraumatic.  Right Ear: Tympanic membrane, external ear and ear canal normal.  Left Ear: Tympanic membrane, external ear and ear canal normal.  Nose: Nose normal.  Mouth/Throat: Uvula is midline, oropharynx is clear and moist and mucous membranes are normal. No trismus in the jaw. Abnormal dentition. Dental caries present. No dental abscesses or uvula swelling. No tonsillar abscesses.  Patient with extraction of right mandibular first molar. No swelling or erythema noted on exam.  Eyes: Conjunctivae are normal.  Neck: Normal range of motion. Neck supple.  No neck swelling or Ludwig's angina  Lymphadenopathy:    She has no cervical adenopathy.  Neurological: She is alert.  Skin: Skin is warm and dry.  Psychiatric: She has a normal mood and affect.    ED Course  Procedures (including critical care time) Labs Review Labs Reviewed - No data to display Imaging Review No results found.  EKG Interpretation   None      2:13 AM Patient seen and examined. Will dental block. Medications ordered.   Vital signs reviewed and are as follows: Filed Vitals:   06/27/13 0147  BP: 123/105  Pulse: 142  Temp: 98.2 F (36.8 C)  Resp: 18   Dental block was performed. 1.38mL of 2% lidocaine with  epi was combined with 1.63mL 0.5% bupivacaine with epi and an alveolar block was performed. Injections made at base of tooth as well as the tooth immediately posterior and anterior to tooth. Adequate anesthesia was obtained. Minimal bleeding after injections. Patient tolerated procedure well with no immediate complications.   Patient counseled on use of narcotic pain medications. Counseled not to combine these medications with others containing tylenol. Urged not to drink alcohol, drive, or perform any other activities that requires focus while taking these medications. The patient verbalizes  understanding and agrees with the plan.     MDM   1. Pain, dental    Patient with toothache.  No gross abscess.  Exam unconcerning for Ludwig's angina or other deep tissue infection in neck.  Will treat with pain medicine.  Urged patient to follow-up with dentist.       Renne Crigler, PA-C 06/27/13 0600

## 2013-06-30 ENCOUNTER — Emergency Department (HOSPITAL_COMMUNITY)
Admission: EM | Admit: 2013-06-30 | Discharge: 2013-07-01 | Disposition: A | Discharge status: Home / Self Care | Payer: Self-pay | Attending: Emergency Medicine | Admitting: Emergency Medicine

## 2013-06-30 ENCOUNTER — Emergency Department (HOSPITAL_COMMUNITY): Payer: Self-pay

## 2013-06-30 ENCOUNTER — Encounter (HOSPITAL_COMMUNITY): Payer: Self-pay | Admitting: Emergency Medicine

## 2013-06-30 DIAGNOSIS — Z881 Allergy status to other antibiotic agents status: Secondary | ICD-10-CM | POA: Insufficient documentation

## 2013-06-30 DIAGNOSIS — R5381 Other malaise: Secondary | ICD-10-CM | POA: Insufficient documentation

## 2013-06-30 DIAGNOSIS — R0602 Shortness of breath: Secondary | ICD-10-CM | POA: Insufficient documentation

## 2013-06-30 DIAGNOSIS — R51 Headache: Secondary | ICD-10-CM | POA: Insufficient documentation

## 2013-06-30 DIAGNOSIS — R062 Wheezing: Secondary | ICD-10-CM | POA: Insufficient documentation

## 2013-06-30 DIAGNOSIS — Z3202 Encounter for pregnancy test, result negative: Secondary | ICD-10-CM | POA: Insufficient documentation

## 2013-06-30 DIAGNOSIS — N289 Disorder of kidney and ureter, unspecified: Secondary | ICD-10-CM | POA: Insufficient documentation

## 2013-06-30 DIAGNOSIS — R630 Anorexia: Secondary | ICD-10-CM | POA: Insufficient documentation

## 2013-06-30 DIAGNOSIS — M6281 Muscle weakness (generalized): Secondary | ICD-10-CM | POA: Insufficient documentation

## 2013-06-30 DIAGNOSIS — Z888 Allergy status to other drugs, medicaments and biological substances status: Secondary | ICD-10-CM | POA: Insufficient documentation

## 2013-06-30 DIAGNOSIS — R509 Fever, unspecified: Secondary | ICD-10-CM | POA: Insufficient documentation

## 2013-06-30 DIAGNOSIS — J111 Influenza due to unidentified influenza virus with other respiratory manifestations: Secondary | ICD-10-CM | POA: Insufficient documentation

## 2013-06-30 DIAGNOSIS — F17200 Nicotine dependence, unspecified, uncomplicated: Secondary | ICD-10-CM | POA: Insufficient documentation

## 2013-06-30 DIAGNOSIS — IMO0001 Reserved for inherently not codable concepts without codable children: Secondary | ICD-10-CM | POA: Insufficient documentation

## 2013-06-30 DIAGNOSIS — Z87440 Personal history of urinary (tract) infections: Secondary | ICD-10-CM | POA: Insufficient documentation

## 2013-06-30 LAB — URINALYSIS, ROUTINE W REFLEX MICROSCOPIC
Bilirubin Urine: NEGATIVE
Glucose, UA: NEGATIVE mg/dL
Ketones, ur: 15 mg/dL — AB
Nitrite: NEGATIVE
Protein, ur: 30 mg/dL — AB
pH: 6 (ref 5.0–8.0)

## 2013-06-30 LAB — COMPREHENSIVE METABOLIC PANEL
Alkaline Phosphatase: 61 U/L (ref 39–117)
BUN: 6 mg/dL (ref 6–23)
Calcium: 8.6 mg/dL (ref 8.4–10.5)
Creatinine, Ser: 0.67 mg/dL (ref 0.50–1.10)
GFR calc Af Amer: >90 mL/min (ref >90)
Glucose, Bld: 115 mg/dL — ABNORMAL HIGH (ref 70–99)
Total Protein: 7.1 g/dL (ref 6.0–8.3)

## 2013-06-30 LAB — CBC WITH DIFFERENTIAL/PLATELET
Eosinophils Absolute: 0 10*3/uL (ref 0.0–0.7)
Eosinophils Relative: 0 % (ref 0–5)
Hemoglobin: 13.9 g/dL (ref 12.0–15.0)
Lymphs Abs: 2.3 10*3/uL (ref 0.7–4.0)
MCH: 31.9 pg (ref 26.0–34.0)
MCV: 92.2 fL (ref 78.0–100.0)
Monocytes Relative: 10 % (ref 3–12)
RBC: 4.36 MIL/uL (ref 3.87–5.11)

## 2013-06-30 LAB — URINE MICROSCOPIC-ADD ON

## 2013-06-30 MED ORDER — ACETAMINOPHEN 325 MG PO TABS
650.0000 mg | ORAL_TABLET | Freq: Once | ORAL | Status: AC
  Administered 2013-06-30: 650 mg via ORAL

## 2013-06-30 NOTE — ED Notes (Signed)
The  Pt has been ill for several days.  She has had body aches with a headache  And a temp.  She is crying and very rude.  She last had tylenol this am.  She reports that tylenol does not work and if that shit worked she would not be here.  lmp now

## 2013-06-30 NOTE — ED Provider Notes (Signed)
CSN: [id number]     Arrival date & time 06/30/13  2209 History   First MD Initiated Contact with Patient 06/30/13 2352     Chief Complaint  Patient presents with  . Flu-Like Symptoms    (Consider location/radiation/quality/duration/timing/severity/associated sxs/prior Treatment) HPI This is a 22 year old female with a four-day history of flulike symptoms. Specifically she has had fever as high as 104.3, body aches, headache, sore throat, cough, shortness of breath, weakness, malaise and decreased appetite. She denies nasal congestion. She denies nausea, vomiting or diarrhea. She has been taking Tylenol without significant relief of her symptoms. She was given Tylenol in triage her temperature 101.5. Her symptoms are moderate to severe. She did not receive a flu vaccination this season.  Past Medical History  Diagnosis Date  . H/O bladder infections   . Mastitis   . Renal disorder    Past Surgical History  Procedure Laterality Date  . Cesarean section    . Tooth extraction     No family history on file. History  Substance Use Topics  . Smoking status: Current Every Day Smoker  . Smokeless tobacco: Not on file  . Alcohol Use: Yes     Comment: occ   OB History   Grav Para Term Preterm Abortions TAB SAB Ect Mult Living                 Review of Systems  All other systems reviewed and are negative.    Allergies  Aspirin and Bactrim  Home Medications   Current Outpatient Rx  Name  Route  Sig  Dispense  Refill  . ibuprofen (ADVIL,MOTRIN) 800 MG tablet   Oral   Take 1 tablet (800 mg total) by mouth 3 (three) times daily.   21 tablet   0   . oxyCODONE-acetaminophen (PERCOCET/ROXICET) 5-325 MG per tablet   Oral   Take 1-2 tablets by mouth every 6 (six) hours as needed for severe pain.   8 tablet   0    BP 119/63  Pulse 65  Temp(Src) 101.5 F (38.6 C) (Oral)  Resp 20  SpO2 98%  LMP 06/16/2013  Physical Exam General: Well-developed, well-nourished female in no  acute distress; appearance consistent with age of record HENT: normocephalic; atraumatic Eyes: pupils equal, round and reactive to light; extraocular muscles intact Neck: supple Heart: regular rate and rhythm; no murmurs, rubs or gallops Lungs: Expiratory wheezing Abdomen: soft; nondistended; nontender; no masses or hepatosplenomegaly; bowel sounds present Extremities: No deformity; full range of motion; pulses normal Neurologic: Awake, alert and oriented; motor function intact in all extremities and symmetric; no facial droop Skin: Warm and dry Psychiatric: Flat affect    ED Course  Procedures (including critical care time)  MDM   Nursing notes and vitals signs, including pulse oximetry, reviewed.  Summary of this visit's results, reviewed by myself:  Labs:  Results for orders placed during the hospital encounter of 06/30/13 (from the past 24 hour(s))  URINALYSIS, ROUTINE W REFLEX MICROSCOPIC     Status: Abnormal   Collection Time    06/30/13 10:25 PM      Result Value Range   Color, Urine YELLOW  YELLOW   APPearance CLOUDY (*) CLEAR   Specific Gravity, Urine 1.021  1.005 - 1.030   pH 6.0  5.0 - 8.0   Glucose, UA NEGATIVE  NEGATIVE mg/dL   Hgb urine dipstick TRACE (*) NEGATIVE   Bilirubin Urine NEGATIVE  NEGATIVE   Ketones, ur 15 (*) NEGATIVE mg/dL  Protein, ur 30 (*) NEGATIVE mg/dL   Urobilinogen, UA 1.0  0.0 - 1.0 mg/dL   Nitrite NEGATIVE  NEGATIVE   Leukocytes, UA NEGATIVE  NEGATIVE  PREGNANCY, URINE     Status: None   Collection Time    06/30/13 10:25 PM      Result Value Range   Preg Test, Ur NEGATIVE  NEGATIVE  URINE MICROSCOPIC-ADD ON     Status: Abnormal   Collection Time    06/30/13 10:25 PM      Result Value Range   Squamous Epithelial / LPF MANY (*) RARE   WBC, UA 0-2  <3 WBC/hpf   RBC / HPF 0-2  <3 RBC/hpf   Bacteria, UA FEW (*) RARE   Urine-Other MUCOUS PRESENT    CBC WITH DIFFERENTIAL     Status: Abnormal   Collection Time    06/30/13 10:26 PM       Result Value Range   WBC 14.6 (*) 4.0 - 10.5 K/uL   RBC 4.36  3.87 - 5.11 MIL/uL   Hemoglobin 13.9  12.0 - 15.0 g/dL   HCT 40.9  81.1 - 91.4 %   MCV 92.2  78.0 - 100.0 fL   MCH 31.9  26.0 - 34.0 pg   MCHC 34.6  30.0 - 36.0 g/dL   RDW 78.2  95.6 - 21.3 %   Platelets 263  150 - 400 K/uL   Neutrophils Relative % 74  43 - 77 %   Neutro Abs 10.8 (*) 1.7 - 7.7 K/uL   Lymphocytes Relative 16  12 - 46 %   Lymphs Abs 2.3  0.7 - 4.0 K/uL   Monocytes Relative 10  3 - 12 %   Monocytes Absolute 1.4 (*) 0.1 - 1.0 K/uL   Eosinophils Relative 0  0 - 5 %   Eosinophils Absolute 0.0  0.0 - 0.7 K/uL   Basophils Relative 0  0 - 1 %   Basophils Absolute 0.0  0.0 - 0.1 K/uL  COMPREHENSIVE METABOLIC PANEL     Status: Abnormal   Collection Time    06/30/13 10:26 PM      Result Value Range   Sodium 132 (*) 135 - 145 mEq/L   Potassium 3.1 (*) 3.5 - 5.1 mEq/L   Chloride 97  96 - 112 mEq/L   CO2 25  19 - 32 mEq/L   Glucose, Bld 115 (*) 70 - 99 mg/dL   BUN 6  6 - 23 mg/dL   Creatinine, Ser 0.86  0.50 - 1.10 mg/dL   Calcium 8.6  8.4 - 57.8 mg/dL   Total Protein 7.1  6.0 - 8.3 g/dL   Albumin 3.5  3.5 - 5.2 g/dL   AST 28  0 - 37 U/L   ALT 15  0 - 35 U/L   Alkaline Phosphatase 61  39 - 117 U/L   Total Bilirubin 0.3  0.3 - 1.2 mg/dL   GFR calc non Af Amer >90  >90 mL/min   GFR calc Af Amer >90  >90 mL/min    Imaging Studies: Dg Chest 2 View  06/30/2013   CLINICAL DATA:  Cough; fever. Body aches. Headache. History of smoking.  EXAM: CHEST  2 VIEW  COMPARISON:  Chest radiograph performed 08/06/2011  FINDINGS: The lungs are well-aerated and clear. There is no evidence of focal opacification, pleural effusion or pneumothorax.  The heart is normal in size; the mediastinal contour is within normal limits. No acute osseous abnormalities are seen.  IMPRESSION: No acute cardiopulmonary process seen.   Electronically Signed   By: Roanna Raider M.D.   On: 06/30/2013 23:23   Symptoms consistent with  influenza.     Hanley Seamen, MD 07/01/13 [phone]

## 2013-07-01 MED ORDER — ALBUTEROL SULFATE HFA 108 (90 BASE) MCG/ACT IN AERS
2.0000 | INHALATION_SPRAY | RESPIRATORY_TRACT | Status: DC | PRN
  Administered 2013-07-01: 2 via RESPIRATORY_TRACT
  Filled 2013-07-01: qty 6.7

## 2013-07-01 MED ORDER — HYDROCOD POLST-CHLORPHEN POLST 10-8 MG/5ML PO LQCR
5.0000 mL | Freq: Once | ORAL | Status: AC
  Administered 2013-07-01: 5 mL via ORAL
  Filled 2013-07-01: qty 5

## 2013-07-01 MED ORDER — HYDROCOD POLST-CHLORPHEN POLST 10-8 MG/5ML PO LQCR: 5.0000 mL | Freq: Two times a day (BID) | ORAL | Status: DC | PRN

## 2013-07-03 ENCOUNTER — Encounter (HOSPITAL_COMMUNITY): Payer: Self-pay | Admitting: Emergency Medicine

## 2013-07-03 ENCOUNTER — Emergency Department (HOSPITAL_COMMUNITY)
Admission: EM | Admit: 2013-07-03 | Discharge: 2013-07-03 | Disposition: A | Discharge status: Home / Self Care | Payer: Self-pay | Attending: Emergency Medicine | Admitting: Emergency Medicine

## 2013-07-03 ENCOUNTER — Emergency Department (HOSPITAL_COMMUNITY): Payer: Self-pay

## 2013-07-03 DIAGNOSIS — Z87440 Personal history of urinary (tract) infections: Secondary | ICD-10-CM | POA: Insufficient documentation

## 2013-07-03 DIAGNOSIS — J029 Acute pharyngitis, unspecified: Secondary | ICD-10-CM | POA: Insufficient documentation

## 2013-07-03 DIAGNOSIS — R52 Pain, unspecified: Secondary | ICD-10-CM | POA: Insufficient documentation

## 2013-07-03 DIAGNOSIS — R112 Nausea with vomiting, unspecified: Secondary | ICD-10-CM | POA: Insufficient documentation

## 2013-07-03 DIAGNOSIS — Z8742 Personal history of other diseases of the female genital tract: Secondary | ICD-10-CM | POA: Insufficient documentation

## 2013-07-03 DIAGNOSIS — R6889 Other general symptoms and signs: Secondary | ICD-10-CM

## 2013-07-03 DIAGNOSIS — F17200 Nicotine dependence, unspecified, uncomplicated: Secondary | ICD-10-CM | POA: Insufficient documentation

## 2013-07-03 DIAGNOSIS — Z87448 Personal history of other diseases of urinary system: Secondary | ICD-10-CM | POA: Insufficient documentation

## 2013-07-03 MED ORDER — KETOROLAC TROMETHAMINE 30 MG/ML IJ SOLN
30.0000 mg | Freq: Once | INTRAMUSCULAR | Status: AC
  Administered 2013-07-03: 30 mg via INTRAVENOUS
  Filled 2013-07-03: qty 1

## 2013-07-03 MED ORDER — ALBUTEROL SULFATE HFA 108 (90 BASE) MCG/ACT IN AERS
2.0000 | INHALATION_SPRAY | Freq: Once | RESPIRATORY_TRACT | Status: AC
  Administered 2013-07-03: 2 via RESPIRATORY_TRACT
  Filled 2013-07-03: qty 6.7

## 2013-07-03 MED ORDER — ONDANSETRON HCL 4 MG/2ML IJ SOLN
4.0000 mg | Freq: Once | INTRAMUSCULAR | Status: AC
  Administered 2013-07-03: 4 mg via INTRAVENOUS
  Filled 2013-07-03: qty 2

## 2013-07-03 MED ORDER — MORPHINE SULFATE 4 MG/ML IJ SOLN
4.0000 mg | Freq: Once | INTRAMUSCULAR | Status: AC
  Administered 2013-07-03: 4 mg via INTRAVENOUS
  Filled 2013-07-03: qty 1

## 2013-07-03 MED ORDER — SODIUM CHLORIDE 0.9 % IV BOLUS (SEPSIS)
1000.0000 mL | Freq: Once | INTRAVENOUS | Status: AC
  Administered 2013-07-03: 1000 mL via INTRAVENOUS

## 2013-07-03 NOTE — ED Provider Notes (Signed)
CSN: [id number]     Arrival date & time 07/03/13  1434 History   First MD Initiated Contact with Patient 07/03/13 1500     Chief Complaint  Patient presents with  . Influenza  . URI   (Consider location/radiation/quality/duration/timing/severity/associated sxs/prior Treatment) HPI  22 year old female with flulike symptoms. Onset approximately one week ago. Symptoms have not significantly improved. She's in emergency room 2 days ago and give a new prescription for cough medication which he cannot afford. Since his last evaluated she's had continued body aches, headaches, sore throat nausea and vomiting. No sick contacts. No rash. Did not receive the flu shot this year.  Past Medical History  Diagnosis Date  . H/O bladder infections   . Mastitis   . Renal disorder    Past Surgical History  Procedure Laterality Date  . Cesarean section    . Tooth extraction     No family history on file. History  Substance Use Topics  . Smoking status: Current Every Day Smoker  . Smokeless tobacco: Not on file  . Alcohol Use: Yes     Comment: occ   OB History   Grav Para Term Preterm Abortions TAB SAB Ect Mult Living                 Review of Systems  All systems reviewed and negative, other than as noted in HPI.   Allergies  Aspirin and Bactrim  Home Medications   Current Outpatient Rx  Name  Route  Sig  Dispense  Refill  . acetaminophen (TYLENOL) 500 MG tablet   Oral   Take 2,000 mg by mouth every 6 (six) hours as needed.         Marland Kitchen albuterol (PROVENTIL HFA;VENTOLIN HFA) 108 (90 BASE) MCG/ACT inhaler   Inhalation   Inhale 1 puff into the lungs every 6 (six) hours as needed for wheezing or shortness of breath.         . guaiFENesin (ROBITUSSIN) 100 MG/5ML liquid   Oral   Take 200 mg by mouth 3 (three) times daily as needed for cough.         . chlorpheniramine-HYDROcodone (TUSSIONEX PENNKINETIC ER) 10-8 MG/5ML LQCR   Oral   Take 5 mLs by mouth every 12 (twelve) hours  as needed for cough.   50 mL   0   . oxyCODONE-acetaminophen (PERCOCET/ROXICET) 5-325 MG per tablet   Oral   Take 1-2 tablets by mouth every 6 (six) hours as needed for severe pain.   8 tablet   0    BP 136/67  Pulse 77  Temp(Src) 98.6 F (37 C) (Oral)  Resp 17  SpO2 96%  LMP 06/16/2013 Physical Exam  Nursing note and vitals reviewed. Constitutional: She appears well-developed and well-nourished. No distress.  HENT:  Head: Normocephalic and atraumatic.  Eyes: Conjunctivae are normal. Right eye exhibits no discharge. Left eye exhibits no discharge.  Neck: Neck supple.  No nuchal rigidity  Cardiovascular: Normal rate, regular rhythm and normal heart sounds.  Exam reveals no gallop and no friction rub.   No murmur heard. Pulmonary/Chest: Effort normal and breath sounds normal. No respiratory distress.  Abdominal: Soft. She exhibits no distension. There is no tenderness.  Genitourinary:  No cva tenderness  Musculoskeletal: She exhibits no edema and no tenderness.  Lower extremities symmetric as compared to each other. No calf tenderness. Negative Homan's. No palpable cords.   Neurological: She is alert.  Skin: Skin is warm and dry.  Psychiatric: She has  a normal mood and affect. Her behavior is normal. Thought content normal.    ED Course  Procedures (including critical care time) Labs Review Labs Reviewed - No data to display Imaging Review Dg Chest 2 View  07/03/2013   CLINICAL DATA:  Cough and fever.  EXAM: CHEST  2 VIEW  COMPARISON:  Two-view chest 06/30/2013.  FINDINGS: The heart size is normal. Chronic bronchitic changes are again is seen. No focal airspace disease is evident. The visualized soft tissues and bony thorax are unremarkable.  IMPRESSION: 1. Stable chronic bronchitic changes suggesting history of reactive airways disease. 2. No acute cardiopulmonary disease.   Electronically Signed   By: Gennette Pac M.D.   On: 07/03/2013 16:22    EKG Interpretation    None       MDM   1. Flu-like symptoms    22 year old female with likely influenza. Nontoxic. Treated symptomatically. Very low suspicion for serious bacterial illness, significant metabolic arrangement for other potential emergent pathology. Patient feels better after IV fluids and medications. Emergent return precautions were discussed. Outpatient followup otherwise.    Raeford Razor, MD 07/10/13 2039

## 2013-07-03 NOTE — ED Notes (Signed)
Per EMS pt c/o flu x4 days, states she was tx'd but unable to afford the meds. Unable to eat or drink, vomiting and diarrhea; 20g IV to lt AC and zofran 4mg  given

## 2013-07-03 NOTE — ED Notes (Signed)
Bed: NW29 Expected date:  Expected time:  Means of arrival:  Comments: flu

## 2014-01-27 ENCOUNTER — Encounter (HOSPITAL_COMMUNITY): Payer: Self-pay | Admitting: Emergency Medicine

## 2014-01-27 ENCOUNTER — Emergency Department (HOSPITAL_COMMUNITY)
Admission: EM | Admit: 2014-01-27 | Discharge: 2014-01-27 | Disposition: A | Discharge status: Home / Self Care | Payer: Medicaid Other | Attending: Emergency Medicine | Admitting: Emergency Medicine

## 2014-01-27 DIAGNOSIS — F17200 Nicotine dependence, unspecified, uncomplicated: Secondary | ICD-10-CM | POA: Insufficient documentation

## 2014-01-27 DIAGNOSIS — Z87440 Personal history of urinary (tract) infections: Secondary | ICD-10-CM | POA: Insufficient documentation

## 2014-01-27 DIAGNOSIS — K029 Dental caries, unspecified: Secondary | ICD-10-CM

## 2014-01-27 MED ORDER — OXYCODONE-ACETAMINOPHEN 5-325 MG PO TABS
1.0000 | ORAL_TABLET | Freq: Once | ORAL | Status: AC
  Administered 2014-01-27: 1 via ORAL
  Filled 2014-01-27: qty 1

## 2014-01-27 MED ORDER — IBUPROFEN 200 MG PO TABS
600.0000 mg | ORAL_TABLET | Freq: Once | ORAL | Status: AC
  Administered 2014-01-27: 600 mg via ORAL
  Filled 2014-01-27: qty 3

## 2014-01-27 MED ORDER — OXYCODONE-ACETAMINOPHEN 5-325 MG PO TABS: 1.0000 | ORAL_TABLET | Freq: Four times a day (QID) | ORAL | Status: DC | PRN

## 2014-01-27 MED ORDER — CLINDAMYCIN HCL 150 MG PO CAPS: 150.0000 mg | ORAL_CAPSULE | Freq: Four times a day (QID) | ORAL | Status: DC

## 2014-01-27 NOTE — ED Provider Notes (Signed)
CSN: [id number]     Arrival date & time 01/27/14  0539 History   First MD Initiated Contact with Patient 01/27/14 [phone]     Chief Complaint  Patient presents with  . Dental Problem     (Consider location/radiation/quality/duration/timing/severity/associated sxs/prior Treatment) HPI Pt is a 23yo female c/o left lower dental pain that started about 2 weeks ago. Pain is constant, waxing and waning aching and throbbing.  Pain worsened significantly since last night, 10/10, no improvement with acetaminophen, salt water gargle or oral-gel OTC pain medication.  Denies fever, n/v/d. Denies trauma to tooth. Denies difficulty breathing or swallowing. Denies having a Education officer, communitydentist.   Past Medical History  Diagnosis Date  . H/O bladder infections   . Mastitis   . Renal disorder    Past Surgical History  Procedure Laterality Date  . Cesarean section    . Tooth extraction     No family history on file. History  Substance Use Topics  . Smoking status: Current Every Day Smoker  . Smokeless tobacco: Not on file  . Alcohol Use: Yes     Comment: occ   OB History   Grav Para Term Preterm Abortions TAB SAB Ect Mult Living                 Review of Systems  Constitutional: Negative for fever, chills, diaphoresis, appetite change and fatigue.  HENT: Positive for dental problem. Negative for congestion and sore throat.   Respiratory: Negative for cough, choking and shortness of breath.   Gastrointestinal: Negative for nausea and vomiting.  All other systems reviewed and are negative.     Allergies  Aspirin and Bactrim  Home Medications   Prior to Admission medications   Medication Sig Start Date End Date Taking? Authorizing Provider  diphenhydramine-acetaminophen (TYLENOL PM) 25-500 MG TABS Take 2 tablets by mouth at bedtime as needed (for pain).   Yes Historical Provider, MD  clindamycin (CLEOCIN) 150 MG capsule Take 1 capsule (150 mg total) by mouth every 6 (six) hours. 01/27/14   Junius FinnerErin  O'Malley, PA-C  oxyCODONE-acetaminophen (PERCOCET/ROXICET) 5-325 MG per tablet Take 1 tablet by mouth every 6 (six) hours as needed for moderate pain or severe pain. 01/27/14   Junius FinnerErin O'Malley, PA-C   BP 132/74  Pulse 74  Temp(Src) 98.2 F (36.8 C) (Oral)  Resp 16  SpO2 99% Physical Exam  Nursing note and vitals reviewed. Constitutional: She is oriented to person, place, and time. She appears well-developed and well-nourished.  Pt tearful, appears uncomfortable.  HENT:  Head: Normocephalic and atraumatic.  Nose: Nose normal.  Mouth/Throat: Uvula is midline and oropharynx is clear and moist. She does not have dentures. No oral lesions. No trismus in the jaw. Normal dentition. Dental caries (tooth #18, tenderness of tooth and surrounding gingiva w/o abscess) present. No dental abscesses, uvula swelling or lacerations. No oropharyngeal exudate, posterior oropharyngeal edema, posterior oropharyngeal erythema or tonsillar abscesses.  Eyes: EOM are normal.  Neck: Normal range of motion.  Cardiovascular: Normal rate.   Pulmonary/Chest: Effort normal.  Musculoskeletal: Normal range of motion.  Neurological: She is alert and oriented to person, place, and time.  Skin: Skin is warm and dry.  Psychiatric: She has a normal mood and affect. Her behavior is normal.    ED Course  Procedures (including critical care time) Labs Review Labs Reviewed - No data to display  Imaging Review No results found.   EKG Interpretation None      MDM   Final diagnoses:  Pain due to dental caries    Pt is a 23yo female c/o 2 week hx of worsening left lower dental pain due to dental caries w/o evidence of gingival abscess. Denies difficulty breathing or swallowing.  Denies fever, n/v/d.  Will tx with percocet and clindamycin. Advised to f/u with dentist. Dental resource guide provided.  Return precautions provided. Pt verbalized understanding and agreement with tx plan.     Junius FinnerErin O'Malley,  PA-C 01/27/14 [phone]

## 2014-01-27 NOTE — ED Notes (Signed)
The pt has had a toothache for 2 weeks

## 2014-01-27 NOTE — Discharge Instructions (Signed)
Dental Pain Toothache is pain in or around a tooth. It may get worse with chewing or with cold or heat.  HOME CARE  Your dentist may use a numbing medicine during treatment. If so, you may need to avoid eating until the medicine wears off. Ask your dentist about this.  Only take medicine as told by your dentist or doctor.  Avoid chewing food near the painful tooth until after all treatment is done. Ask your dentist about this. GET HELP RIGHT AWAY IF:   The problem gets worse or new problems appear.  You have a fever.  There is redness and puffiness (swelling) of the face, jaw, or neck.  You cannot open your mouth.  There is pain in the jaw.  There is very bad pain that is not helped by medicine. MAKE SURE YOU:   Understand these instructions.  Will watch your condition.  Will get help right away if you are not doing well or get worse. Document Released: 12/28/2007 Document Revised: 10/03/2011 Document Reviewed: 12/28/2007 [facility] Patient Information 2015 [place], [place]. This information is not intended to replace advice given to you by your health care provider. Make sure you discuss any questions you have with your health care provider.  Dental Care and Dentist Visits Dental care supports good overall health. Regular dental visits can also help you avoid dental pain, bleeding, infection, and other more serious health problems in the future. It is important to keep the mouth healthy because diseases in the teeth, gums, and other oral tissues can spread to other areas of the body. Some problems, such as diabetes, heart disease, and pre-term labor have been associated with poor oral health.  See your dentist every 6 months. If you experience emergency problems such as a toothache or broken tooth, go to the dentist right away. If you see your dentist regularly, you may catch problems early. It is easier to be treated for problems in the early stages.  WHAT TO EXPECT AT A DENTIST  VISIT  Your dentist will look for many common oral health problems and recommend proper treatment. At your regular dental visit, you can expect:  Gentle cleaning of the teeth and gums. This includes scraping and polishing. This helps to remove the sticky substance around the teeth and gums (plaque). Plaque forms in the mouth shortly after eating. Over time, plaque hardens on the teeth as tartar. If tartar is not removed regularly, it can cause problems. Cleaning also helps remove stains.  Periodic X-rays. These pictures of the teeth and supporting bone will help your dentist assess the health of your teeth.  Periodic fluoride treatments. Fluoride is a natural mineral shown to help strengthen teeth. Fluoride treatmentinvolves applying a fluoride gel or varnish to the teeth. It is most commonly done in children.  Examination of the mouth, tongue, jaws, teeth, and gums to look for any oral health problems, such as:  Cavities (dental caries). This is decay on the tooth caused by plaque, sugar, and acid in the mouth. It is best to catch a cavity when it is small.  Inflammation of the gums caused by plaque buildup (gingivitis).  Problems with the mouth or malformed or misaligned teeth.  Oral cancer or other diseases of the soft tissues or jaws. KEEP YOUR TEETH AND GUMS HEALTHY For healthy teeth and gums, follow these general guidelines as well as your dentist's specific advice:  Have your teeth professionally cleaned at the dentist every 6 months.  Brush twice daily with a  fluoride toothpaste.  Floss your teeth daily.  Ask your dentist if you need fluoride supplements, treatments, or fluoride toothpaste.  Eat a healthy diet. Reduce foods and drinks with added sugar.  Avoid smoking. TREATMENT FOR ORAL HEALTH PROBLEMS If you have oral health problems, treatment varies depending on the conditions present in your teeth and gums.  Your caregiver will most likely recommend good oral hygiene  at each visit.  For cavities, gingivitis, or other oral health disease, your caregiver will perform a procedure to treat the problem. This is typically done at a separate appointment. Sometimes your caregiver will refer you to another dental specialist for specific tooth problems or for surgery. SEEK IMMEDIATE DENTAL CARE IF:  You have pain, bleeding, or soreness in the gum, tooth, jaw, or mouth area.  A permanent tooth becomes loose or separated from the gum socket.  You experience a blow or injury to the mouth or jaw area. Document Released: 03/23/2011 Document Revised: 10/03/2011 Document Reviewed: 03/23/2011 [facility] Patient Information 2015 [place], [place]. This information is not intended to replace advice given to you by your health care provider. Make sure you discuss any questions you have with your health care provider.  Emergency Department Resource Guide 1) Find a Doctor and Pay Out of Pocket Although you won't have to find out who is covered by your insurance plan, it is a good idea to ask around and get recommendations. You will then need to call the office and see if the doctor you have chosen will accept you as a new patient and what types of options they offer for patients who are self-pay. Some doctors offer discounts or will set up payment plans for their patients who do not have insurance, but you will need to ask so you aren't surprised when you get to your appointment.  2) Contact Your Local Health Department Not all health departments have doctors that can see patients for sick visits, but many do, so it is worth a call to see if yours does. If you don't know where your local health department is, you can check in your phone book. The CDC also has a tool to help you locate your state's health department, and many state websites also have listings of all of their local health departments.  3) Find a Walk-in Clinic If your illness is not likely to be very severe or  complicated, you may want to try a walk in clinic. These are popping up all over the country in pharmacies, drugstores, and shopping centers. They're usually staffed by nurse practitioners or physician assistants that have been trained to treat common illnesses and complaints. They're usually fairly quick and inexpensive. However, if you have serious medical issues or chronic medical problems, these are probably not your best option.  No Primary Care Doctor: - Call Health Connect at  [phone] - they can help you locate a primary care doctor that  accepts your insurance, provides certain services, etc. - Physician Referral Service- [phone]  Chronic Pain Problems: Organization         Address  Phone   Notes  [facility]  [phone] Patients need to be referred by their primary care doctor.   Medication Assistance: Organization         Address  Phone   Notes  [facility] Medication [facility] [address] [place], [place] [address] [phone] --Must be a resident of [facility] -- Must have NO insurance coverage  whatsoever (no Medicaid/ Medicare, etc.) -- The pt. MUST have a primary care doctor that directs their care regularly and follows them in the community   MedAssist  [phone]   [employer]  [phone]) [phone]    Agencies that provide inexpensive medical care: Organization         Address                                                       Phone                                                                            Notes  [facility]  [phone]   [facility]    [phone]   [facility] [address] [place], [place] [address] [phone]) [phone]   [facility] [address], [place] [phone](336) [phone]   [facility]anned Parenthood    [phone](336) [phone]   [facility]    [phone]) [phone]   [facility] and [facility]  [address], [place] Phone:  [phone](336) [phone], Fax:  [phone](336) [phone] Hours of Operation:  9 am - 6 pm, M-F.  Also accepts Medicaid/Medicare and self-pay.  [facility]  [address], [place] Phone: [phone]) [phone], Fax: [phone](336) [phone]. Hours of Operation:  8:30 am - 5:30 pm, M-F.  Also accepts Medicaid and self-pay.  [facility] [place] [address] [place] [address], [place] Phone: [phone](336) [phone]   [facility] [address], [place], [place] [phone], Ext. [phone] Mondays & Thursdays: 7-9 AM.  First 15 patients are seen on a first come, first serve basis.    Medicaid-accepting [facility] Providers:  Organization         Address                                                                       Phone                               Notes  [facility] [address], [place] [phone]) [phone] Also accepts self-pay patients.  [facility] [address], [place]  [phone]) [phone]   [facility] [address], [place] [phone]) [phone]   [facility] [address] [place] [address], [place] [phone]) [phone]   Renaye RakersVeita Bland [address], [place]   [phone]) [phone] Only accepts [place] Access [place] patients after they have their name applied to their card.   Self-Pay (no insurance) in [place] [facility]:   Organization  Address                                                     Phone               Notes  Sickle Cell Patients, [facility] [address] [place], [place] [phone]   [facility] [address] [place], [place] [phone]   [facility]  [address], [place] [phone]   [facility]/Dr. Osei-Bonsu  [address] [place] or [address], [place] [phone] Phone number for both [place] and [place]  locations is the same.  [facility] [address] [place] [phone]   [facility] [address] [place] or [address] [phone]   [facility] [address], [place] [phone], phone; [phone], fax Sees patients 1st and 3rd Saturday of every month.  Must not qualify for public or private insurance (i.e. Medicaid, Medicare, [place] Health Choice, Veterans' Benefits)  Household income should be no more than 200% of the poverty level The clinic cannot treat you if you are pregnant or think you are pregnant  Sexually transmitted diseases are not treated at the clinic.                        Dental Care:                                Organization         Address                                  Phone                       Notes  [facility] of [facility] [address] [place], [place] [phone] Accepts children up to age 6 who are enrolled in [place] or [place] Health Choice; pregnant women with a Medicaid card; and children who have applied for Medicaid or [place] Health Choice, but were declined, whose parents can pay a reduced fee at time of service.  [facility]  [address], [place] [phone] Accepts children up to age 82 who are enrolled in [place] or [place] Health Choice; pregnant women with a Medicaid card; and children who have applied for Medicaid or [place] Health Choice, but were declined, whose parents can pay a reduced fee at time of service.  [facility]  [address] [place], [place] [phone] Patients are seen by appointment only. Walk-ins are not accepted. [facility] will see patients 22 years of age and older. Monday - Tuesday (8am-5pm) Most Wednesdays (8:30-5pm) $30 per visit, cash only  [facility]  [address], [facility] [phone] Patients are seen by appointment only. Walk-ins are not accepted. [facility] will see patients 64 years of age and older. One Wednesday Evening (Monthly: Volunteer Based).  $  30 per visit, cash only  [employer] of Dentistry Clinics  [phone] for adults; Children under age 67, call Graduate Pediatric Dentistry at [phone]. Children aged 62-14, please call [phone] to request a pediatric application.  Dental services are provided in all areas of dental care including fillings, crowns and bridges, complete and partial dentures, implants, gum treatment, root canals, and extractions. Preventive care is also provided. Treatment is provided to both adults and children. Patients are selected via a lottery and there is often a waiting list.   [facility] [address], [place]  [phone] [email or web address]   [facility] [address] [place], [place] [phone], Ext. [phone] Second and Fourth Thursday of each month, opens at 6:30 AM; Clinic ends at 9 AM.  Patients are seen on a first-come first-served basis, and a limited number are seen during each clinic.   [facility]  [address] Ether Griffins [place], [place] [phone]   Eligibility Requirements You must have lived in [place], [place], or [place] counties for at least the last three months.   You cannot be eligible for state or federal sponsored [place], including [employer], [place], or [employer].   You generally cannot be eligible for healthcare insurance through your employer.    How to apply: Eligibility screenings are held every Tuesday and Wednesday afternoon from 1:00 pm until 4:00 pm. You do not need an appointment for the interview!  [facility] [address], [place], [place] [phone]   [facility] Health Department  [phone]   [facility] Health Department  [phone]   [facility]  Health Department  [phone]    Behavioral Health Resources in the Community: Intensive Outpatient Programs Organization         Address                                              Phone              Notes  [facility] [address] [place], [place] [phone]   [facility] Outpatient [address], [place], [place] [phone]   [facility] [address], [place], [place]  [phone]   [facility] Mental Health [address]  [place], [place] [phone] or [phone]   Substance Abuse Resources Organization         Address                                Phone  Notes  [facility] Drug Services  [phone]   [facility]  8286265811   [facility]  [phone]   Floydene Flock  [phone]   Residential & Outpatient Substance Abuse Program  [phone]   Psychological Services Organization         Address                                  Phone                Notes  [facility]  [phone]   [facility]  [phone]   [facility] Mental Health [address] [place] [phone] or [phone]    Mobile Crisis Teams Organization         Address  Phone  Notes  [facility], Mobile Crisis Care Unit  [phone]   [facility]  [address] [place], [place] [phone]   Doristine Locks [address] [place] [phone]    Self-Help/Support Groups Organization         Address                         Phone             Notes  [facility] [place] - variety of support groups  336- I7437963 Call for more information  Narcotics Anonymous (NA), [facility] [address], [employer] [place]  2 meetings at this location   Statistician         Address                                                    Phone              Notes  [facility]  [address],    [place]  [phone]   [facility]  [address], [place] [address], [place], [place] [phone]   [facility] [address] [place], [place] [phone] Admissions: 8am-3pm M-F  [facility] [address]    [place], [place] [phone]   [facility] [address] [place], [place], [place] [phone]   [facility] [address],  [place], [place] [phone]   [facility] - Intensive Outpatient [address], [place], [place] [phone]   [facility] ([facility] [address] [place].,  [place], [place] [phone] or [phone]   [facility] ([facility]) [address] [place], [place] [phone] Accepts Medicaid  [facility] [address],  [place] [phone] Substance Abuse/Addiction Treatment   [facility] Organization         Address                                                            Phone                    Notes  [facility]  [phone]   Angie Fava, PhD [address]vin [place], [place]   [phone] or [phone]   [facility]   [address] [place], [place] [phone]   [facility] [address] [place], [place] [phone] Insurance/Medicaid/sponsorship through [facility] [address]                                    [place], [place] [phone] Therapy/tele-psych/case  [facility] [address], [place] [phone]    Dr. Lolly Mustache  [phone]   [facility] [place]  [employer] [facility] 1) [address], [place] 2) [address], [place] 3)  [address], [place] [phone]) [phone](336) [phone]  [phone](336) [phone]   [facility] Child Abuse Hotline [phone](336) [phone] or [phone](336) [phone] (After Hours)

## 2014-01-29 NOTE — ED Provider Notes (Signed)
Medical screening examination/treatment/procedure(s) were performed by non-physician practitioner and as supervising physician I was immediately available for consultation/collaboration.   EKG Interpretation None       Derwood KaplanAnkit Veniamin Kincaid, MD 01/29/14 0400

## 2014-02-08 ENCOUNTER — Encounter (HOSPITAL_COMMUNITY): Payer: Self-pay | Admitting: Emergency Medicine

## 2014-02-08 ENCOUNTER — Emergency Department (HOSPITAL_COMMUNITY): Payer: Medicaid Other

## 2014-02-08 ENCOUNTER — Emergency Department (HOSPITAL_COMMUNITY)
Admission: EM | Admit: 2014-02-08 | Discharge: 2014-02-08 | Disposition: A | Discharge status: Home / Self Care | Payer: Medicaid Other | Attending: Emergency Medicine | Admitting: Emergency Medicine

## 2014-02-08 DIAGNOSIS — F17200 Nicotine dependence, unspecified, uncomplicated: Secondary | ICD-10-CM | POA: Insufficient documentation

## 2014-02-08 DIAGNOSIS — Z79899 Other long term (current) drug therapy: Secondary | ICD-10-CM | POA: Insufficient documentation

## 2014-02-08 DIAGNOSIS — S6990XA Unspecified injury of unspecified wrist, hand and finger(s), initial encounter: Secondary | ICD-10-CM | POA: Insufficient documentation

## 2014-02-08 DIAGNOSIS — M79642 Pain in left hand: Secondary | ICD-10-CM

## 2014-02-08 DIAGNOSIS — Z87448 Personal history of other diseases of urinary system: Secondary | ICD-10-CM | POA: Insufficient documentation

## 2014-02-08 DIAGNOSIS — Z87440 Personal history of urinary (tract) infections: Secondary | ICD-10-CM | POA: Insufficient documentation

## 2014-02-08 MED ORDER — NAPROXEN 500 MG PO TABS: 500.0000 mg | ORAL_TABLET | Freq: Two times a day (BID) | ORAL | Status: DC | PRN

## 2014-02-08 MED ORDER — OXYCODONE-ACETAMINOPHEN 5-325 MG PO TABS: 1.0000 | ORAL_TABLET | Freq: Four times a day (QID) | ORAL | Status: DC | PRN

## 2014-02-08 NOTE — Discharge Instructions (Signed)
Your xrays were negative for any fractures or dislocations. You can wear the splint as needed for comfort over the next week. Expect to be sore for the next few days, take naprosyn and percocet as needed for pain. Use ice for the next 24 hours and then move to heat, 20 minutes at a time and then 1 hour off. Call the orthopedic doctor on Monday and schedule an appointment for 1-2 weeks away, that can be cancelled 24-48 hours before, in the event your hand feels better. If your symptoms change or worsen, return to the emergency department.   Musculoskeletal Pain Musculoskeletal pain is muscle and boney aches and pains. These pains can occur in any part of the body. Your caregiver may treat you without knowing the cause of the pain. They may treat you if blood or urine tests, X-rays, and other tests were normal.  CAUSES There is often not a definite cause or reason for these pains. These pains may be caused by a type of germ (virus). The discomfort may also come from overuse. Overuse includes working out too hard when your body is not fit. Boney aches also come from weather changes. Bone is sensitive to atmospheric pressure changes. HOME CARE INSTRUCTIONS   Ask when your test results will be ready. Make sure you get your test results.  Only take over-the-counter or prescription medicines for pain, discomfort, or fever as directed by your caregiver. If you were given medications for your condition, do not drive, operate machinery or power tools, or sign legal documents for 24 hours. Do not drink alcohol. Do not take sleeping pills or other medications that may interfere with treatment.  Continue all activities unless the activities cause more pain. When the pain lessens, slowly resume normal activities. Gradually increase the intensity and duration of the activities or exercise.  During periods of severe pain, bed rest may be helpful. Lay or sit in any position that is comfortable.  Putting ice on the  injured area.  Put ice in a bag.  Place a towel between your skin and the bag.  Leave the ice on for 15 to 20 minutes, 3 to 4 times a day.  Follow up with your caregiver for continued problems and no reason can be found for the pain. If the pain becomes worse or does not go away, it may be necessary to repeat tests or do additional testing. Your caregiver may need to look further for a possible cause. SEEK IMMEDIATE MEDICAL CARE IF:  You have pain that is getting worse and is not relieved by medications.  You develop chest pain that is associated with shortness or breath, sweating, feeling sick to your stomach (nauseous), or throw up (vomit).  Your pain becomes localized to the abdomen.  You develop any new symptoms that seem different or that concern you. MAKE SURE YOU:   Understand these instructions.  Will watch your condition.  Will get help right away if you are not doing well or get worse. Document Released: 07/11/2005 Document Revised: 10/03/2011 Document Reviewed: 03/15/2013 [facility] Patient Information 2015 [place]. This information is not intended to replace advice given to you by your health care provider. Make sure you discuss any questions you have with your health care provider.  Cryotherapy Cryotherapy means treatment with cold. Ice or gel packs can be used to reduce both pain and swelling. Ice is the most helpful within the first 24 to 48 hours after an injury or flareup from overusing a muscle  or joint. Sprains, strains, spasms, burning pain, shooting pain, and aches can all be eased with ice. Ice can also be used when recovering from surgery. Ice is effective, has very few side effects, and is safe for most people to use. PRECAUTIONS  Ice is not a safe treatment option for people with:  Raynaud's phenomenon. This is a condition affecting small blood vessels in the extremities. Exposure to cold may cause your problems to return.  Cold hypersensitivity.  There are many forms of cold hypersensitivity, including:  Cold urticaria. Red, itchy hives appear on the skin when the tissues begin to warm after being iced.  Cold erythema. This is a red, itchy rash caused by exposure to cold.  Cold hemoglobinuria. Red blood cells break down when the tissues begin to warm after being iced. The hemoglobin that carry oxygen are passed into the urine because they cannot combine with blood proteins fast enough.  Numbness or altered sensitivity in the area being iced. If you have any of the following conditions, do not use ice until you have discussed cryotherapy with your caregiver:  Heart conditions, such as arrhythmia, angina, or chronic heart disease.  High blood pressure.  Healing wounds or open skin in the area being iced.  Current infections.  Rheumatoid arthritis.  Poor circulation.  Diabetes. Ice slows the blood flow in the region it is applied. This is beneficial when trying to stop inflamed tissues from spreading irritating chemicals to surrounding tissues. However, if you expose your skin to cold temperatures for too long or without the proper protection, you can damage your skin or nerves. Watch for signs of skin damage due to cold. HOME CARE INSTRUCTIONS Follow these tips to use ice and cold packs safely.  Place a dry or damp towel between the ice and skin. A damp towel will cool the skin more quickly, so you may need to shorten the time that the ice is used.  For a more rapid response, add gentle compression to the ice.  Ice for no more than 10 to 20 minutes at a time. The bonier the area you are icing, the less time it will take to get the benefits of ice.  Check your skin after 5 minutes to make sure there are no signs of a poor response to cold or skin damage.  Rest 20 minutes or more in between uses.  Once your skin is numb, you can end your treatment. You can test numbness by very lightly touching your skin. The touch should be  so light that you do not see the skin dimple from the pressure of your fingertip. When using ice, most people will feel these normal sensations in this order: cold, burning, aching, and numbness.  Do not use ice on someone who cannot communicate their responses to pain, such as small children or people with dementia. HOW TO MAKE AN ICE PACK Ice packs are the most common way to use ice therapy. Other methods include ice massage, ice baths, and cryo-sprays. Muscle creams that cause a cold, tingly feeling do not offer the same benefits that ice offers and should not be used as a substitute unless recommended by your caregiver. To make an ice pack, do one of the following:  Place crushed ice or a bag of frozen vegetables in a sealable plastic bag. Squeeze out the excess air. Place this bag inside another plastic bag. Slide the bag into a pillowcase or place a damp towel between your skin and the bag.  Mix 3 parts water with 1 part rubbing alcohol. Freeze the mixture in a sealable plastic bag. When you remove the mixture from the freezer, it will be slushy. Squeeze out the excess air. Place this bag inside another plastic bag. Slide the bag into a pillowcase or place a damp towel between your skin and the bag. SEEK MEDICAL CARE IF:  You develop white spots on your skin. This may give the skin a blotchy (mottled) appearance.  Your skin turns blue or pale.  Your skin becomes waxy or hard.  Your swelling gets worse. MAKE SURE YOU:   Understand these instructions.  Will watch your condition.  Will get help right away if you are not doing well or get worse. Document Released: 03/07/2011 Document Revised: 10/03/2011 Document Reviewed: 03/07/2011 [facility] Patient Information 2015 [place], [place]. This information is not intended to replace advice given to you by your health care provider. Make sure you discuss any questions you have with your health care provider.

## 2014-02-08 NOTE — Progress Notes (Signed)
Orthopedic Tech Progress Note Patient Details:  Willeen CassBritney D XXXMichael 1991-06-23 [id number] Applied to LUE; tolerated well. Care instructions explained. Ortho Devices Type of Ortho Device: Ace wrap;Ulna gutter splint Ortho Device/Splint Location: LUE Ortho Device/Splint Interventions: Application   Asia R Thompson 02/08/2014, 8:12 AM
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
## 2014-02-12 ENCOUNTER — Emergency Department (HOSPITAL_COMMUNITY)
Admission: EM | Admit: 2014-02-12 | Discharge: 2014-02-12 | Disposition: A | Discharge status: Home / Self Care | Payer: Medicaid Other | Attending: Emergency Medicine | Admitting: Emergency Medicine

## 2014-02-12 ENCOUNTER — Encounter (HOSPITAL_COMMUNITY): Payer: Self-pay | Admitting: Emergency Medicine

## 2014-02-12 DIAGNOSIS — Z87448 Personal history of other diseases of urinary system: Secondary | ICD-10-CM | POA: Insufficient documentation

## 2014-02-12 DIAGNOSIS — F17200 Nicotine dependence, unspecified, uncomplicated: Secondary | ICD-10-CM | POA: Insufficient documentation

## 2014-02-12 DIAGNOSIS — K0889 Other specified disorders of teeth and supporting structures: Secondary | ICD-10-CM

## 2014-02-12 DIAGNOSIS — Z791 Long term (current) use of non-steroidal anti-inflammatories (NSAID): Secondary | ICD-10-CM | POA: Insufficient documentation

## 2014-02-12 DIAGNOSIS — K089 Disorder of teeth and supporting structures, unspecified: Secondary | ICD-10-CM | POA: Insufficient documentation

## 2014-02-12 DIAGNOSIS — Z8742 Personal history of other diseases of the female genital tract: Secondary | ICD-10-CM | POA: Insufficient documentation

## 2014-02-12 MED ORDER — HYDROCODONE-ACETAMINOPHEN 5-325 MG PO TABS: 1.0000 | ORAL_TABLET | ORAL | Status: DC | PRN

## 2014-02-12 MED ORDER — IBUPROFEN 800 MG PO TABS: 800.0000 mg | ORAL_TABLET | Freq: Three times a day (TID) | ORAL | Status: DC

## 2014-02-12 MED ORDER — OXYCODONE-ACETAMINOPHEN 5-325 MG PO TABS
1.0000 | ORAL_TABLET | Freq: Once | ORAL | Status: AC
Start: 2014-02-12 — End: 2014-02-12
  Administered 2014-02-12: 1 via ORAL
  Filled 2014-02-12: qty 1

## 2014-02-12 NOTE — ED Notes (Signed)
Patient states she had been seen 3 wks ago for dental pain and is still having problems.  Patient complains of left lower dental pain.   Patient states that she cannot go to a dentist because she doesn't have insurance.

## 2014-02-12 NOTE — Discharge Instructions (Signed)
Call for a follow up appointment with a Family or Primary Care Provider.  Return if Symptoms worsen.   Take medication as prescribed.   Use the resource guide listed below to help you find a dentist if you do not already have one to followup with. It is very important that you get evaluated by a dentist as soon as possible. Call tomorrow to schedule an appointment. Use your pain medication as prescribed and do not operate heavy machinery while on pain medication. Note that your pain medication contains acetaminophen (Tylenol) & its is not reccommended that you use additional acetaminophen (Tylenol) while taking this medication. Take your full course of antibiotics. Read the instructions below.  Eat a soft or liquid diet and rinse your mouth out after meals with warm water. You should see a dentist or return here at once if you have increased swelling, increased pain or uncontrolled bleeding from the site of your injury.   SEEK MEDICAL CARE IF:   You have increased pain not controlled with medicines.   You have swelling around your tooth, in your face or neck.   You have bleeding which starts, continues, or gets worse.   You have a fever >101  If you are unable to open your mouth  RESOURCE GUIDE  Dental Problems  Patients with Medicaid: [facility] [phone] [address]                                           [phone] [address] Phone:  [phone]                                                  Phone:  [phone]  If unable to pay or uninsured, contact:  [facility] or [facility]. to become qualified for the adult dental clinic.  Chronic Pain Problems Contact [facility]  [phone] Patients need to be referred by their primary care doctor.  Insufficient Money for Medicine Contact [employer]:  call "211" or [facility] [phone].  No Primary Care Doctor Call [facility]   8673319052 Other agencies that provide inexpensive medical care    [facility]  [phone]    [facility]  [phone]    [facility]  [phone]    [facility]  [phone]    [facility]  [phone]    [facility]  [phone]  Psychological Services [facility]  [phone] [facility]  [phone] [facility]   [phone] (emergency services [phone])  Substance Abuse Resources [facility]  7721597817 [facility] [phone] [facility] [phone] [facility] [phone] Residential & Outpatient Substance Abuse Program  [phone]  Abuse/Neglect [facility] Child Abuse Hotline (843)262-1030 [facility] Child Abuse Hotline 732-234-5405 (After Hours)  Emergency Shelter [facility] (616)664-5981  M[facility] Homes [facility] [place] [facility] 667-612-5893 [facility] Services 252-622-4964  MRSA Hotline #:   8564177595    [facility]  [facility]                          [facility] [address] [place]                       [address]      [address]  [place]                                                [place]                            [place] Phone:  [phone]                                   Phone:  [phone]                 Phone:  [phone]  [facility] Phone:  [phone]  [facility] Child Abuse Hotline 210-496-5233(336) 340-041-6712 [phone]([phone] (After Hours)

## 2014-02-12 NOTE — ED Provider Notes (Signed)
Medical screening examination/treatment/procedure(s) were performed by non-physician practitioner and as supervising physician I was immediately available for consultation/collaboration.   EKG Interpretation None        Layla MawKristen N Ellowyn Rieves, DO 02/12/14 [phone]

## 2014-02-12 NOTE — ED Provider Notes (Signed)
CSN: [id number]     Arrival date & time 02/12/14  0707 History   First MD Initiated Contact with Patient 02/12/14 0710     No chief complaint on file.    (Consider location/radiation/quality/duration/timing/severity/associated sxs/prior Treatment) HPI Comments: The patient is a 23 year old female presenting with left lower gumline pain since 0530 today. She reports similar pain in the past, recently seen and finished her clindamycin as previously prescribed. Denies fever, chills. Has not followed with a dentist as previously advised.  Patient is a 23 y.o. female presenting with tooth pain. The history is provided by the patient and medical records. No language interpreter was used.  Dental Pain Location:  Lower Quality:  Dull, constant and sharp Severity:  Moderate Onset quality:  Gradual Duration:  3 hours Timing:  Constant Progression:  Unchanged Chronicity:  Recurrent Context: poor dentition   Context: not abscess, not malocclusion, not recent dental surgery and not trauma   Relieved by:  None tried Ineffective treatments:  None tried Associated symptoms: no fever   Risk factors: smoking     Past Medical History  Diagnosis Date  . H/O bladder infections   . Mastitis   . Renal disorder    Past Surgical History  Procedure Laterality Date  . Cesarean section    . Tooth extraction     No family history on file. History  Substance Use Topics  . Smoking status: Current Every Day Smoker  . Smokeless tobacco: Not on file  . Alcohol Use: Yes     Comment: occ   OB History   Grav Para Term Preterm Abortions TAB SAB Ect Mult Living                 Review of Systems  Constitutional: Negative for fever and chills.  HENT: Positive for dental problem. Negative for trouble swallowing.   Gastrointestinal: Negative for nausea and vomiting.      Allergies  Aspirin and Bactrim  Home Medications   Prior to Admission medications   Medication Sig Start Date End Date  Taking? Authorizing Provider  diphenhydramine-acetaminophen (TYLENOL PM) 25-500 MG TABS Take 2 tablets by mouth at bedtime as needed (for pain).    Historical Provider, MD  naproxen (NAPROSYN) 500 MG tablet Take 1 tablet (500 mg total) by mouth 2 (two) times daily as needed for mild pain, moderate pain or headache (TAKE WITH MEALS.). 02/08/14   Mercedes Strupp Camprubi-Soms, PA-C  oxyCODONE-acetaminophen (PERCOCET) 5-325 MG per tablet Take 1-2 tablets by mouth every 6 (six) hours as needed for severe pain. 02/08/14   Donnita FallsMercedes Strupp Camprubi-Soms, PA-C   LMP 02/04/2014 Physical Exam  Nursing note and vitals reviewed. Constitutional: She is oriented to person, place, and time. She appears well-developed and well-nourished.  Non-toxic appearance. She does not have a sickly appearance. She does not appear ill. No distress.  HENT:  Head: Normocephalic and atraumatic.  Tenderness to palpation to the gum line of tooth # 18-24. No signs of gingival abscess. No signs of peritonsillar or tonsillar abscess. Oropharynx is clear and without exudates. Soft non-tender sublingual mucosa, no tongue elevation, no edema to sublingual space, normal voice. Airway patent.  Neck: Neck supple.  Pulmonary/Chest: Effort normal. No respiratory distress.  Neurological: She is alert and oriented to person, place, and time.  Skin: Skin is warm and dry. She is not diaphoretic. No erythema.  Psychiatric: She has a normal mood and affect. Her behavior is normal.    ED Course  Procedures (including critical care  time) Labs Review Labs Reviewed - No data to display  Imaging Review No results found.   EKG Interpretation None      MDM   Final diagnoses:  Pain, dental   Pt seen 01/27/2014 seen for dental pain. Patient with toothache.  No gross abscess.  Exam unconcerning for Ludwig's angina or spread of infection.  Will treat with pain medicine.  Urged patient to follow-up with dentist multiple times as previously  advised 01/27/2014, resources given.  Meds given in ED:  Medications  oxyCODONE-acetaminophen (PERCOCET/ROXICET) 5-325 MG per tablet 1 tablet (1 tablet Oral Given 02/12/14 0737)    New Prescriptions   HYDROCODONE-ACETAMINOPHEN (NORCO/VICODIN) 5-325 MG PER TABLET    Take 1 tablet by mouth every 4 (four) hours as needed for moderate pain or severe pain.   IBUPROFEN (ADVIL,MOTRIN) 800 MG TABLET    Take 1 tablet (800 mg total) by mouth 3 (three) times daily with meals.        Clabe Seal, PA-C 02/12/14 [phone]

## 2014-02-13 NOTE — ED Provider Notes (Signed)
Medical screening examination/treatment/procedure(s) were performed by non-physician practitioner and as supervising physician I was immediately available for consultation/collaboration.   EKG Interpretation None        Candyce ChurnJohn David Aerionna Moravek III, MD 02/13/14 [phone]

## 2014-05-06 ENCOUNTER — Encounter (HOSPITAL_COMMUNITY): Payer: Self-pay | Admitting: Emergency Medicine

## 2014-05-06 ENCOUNTER — Emergency Department (HOSPITAL_COMMUNITY)
Admission: EM | Admit: 2014-05-06 | Discharge: 2014-05-06 | Discharge status: Against Medical Advice | Payer: Medicaid Other | Attending: Emergency Medicine | Admitting: Emergency Medicine

## 2014-05-06 DIAGNOSIS — Z720 Tobacco use: Secondary | ICD-10-CM | POA: Insufficient documentation

## 2014-05-06 DIAGNOSIS — F1310 Sedative, hypnotic or anxiolytic abuse, uncomplicated: Secondary | ICD-10-CM | POA: Insufficient documentation

## 2014-05-06 DIAGNOSIS — Z87440 Personal history of urinary (tract) infections: Secondary | ICD-10-CM | POA: Insufficient documentation

## 2014-05-06 DIAGNOSIS — F1210 Cannabis abuse, uncomplicated: Secondary | ICD-10-CM | POA: Insufficient documentation

## 2014-05-06 DIAGNOSIS — F1410 Cocaine abuse, uncomplicated: Secondary | ICD-10-CM | POA: Insufficient documentation

## 2014-05-06 DIAGNOSIS — Z3202 Encounter for pregnancy test, result negative: Secondary | ICD-10-CM | POA: Insufficient documentation

## 2014-05-06 DIAGNOSIS — R110 Nausea: Secondary | ICD-10-CM | POA: Insufficient documentation

## 2014-05-06 DIAGNOSIS — F1110 Opioid abuse, uncomplicated: Secondary | ICD-10-CM | POA: Insufficient documentation

## 2014-05-06 DIAGNOSIS — R6883 Chills (without fever): Secondary | ICD-10-CM | POA: Insufficient documentation

## 2014-05-06 DIAGNOSIS — R61 Generalized hyperhidrosis: Secondary | ICD-10-CM | POA: Insufficient documentation

## 2014-05-06 DIAGNOSIS — M791 Myalgia: Secondary | ICD-10-CM | POA: Insufficient documentation

## 2014-05-06 LAB — COMPREHENSIVE METABOLIC PANEL
ALK PHOS: 82 U/L (ref 39–117)
ALT: 19 U/L (ref 0–35)
AST: 25 U/L (ref 0–37)
Albumin: 3.3 g/dL — ABNORMAL LOW (ref 3.5–5.2)
Anion gap: 17 — ABNORMAL HIGH (ref 5–15)
BUN: 9 mg/dL (ref 6–23)
CO2: 19 meq/L (ref 19–32)
Calcium: 9.3 mg/dL (ref 8.4–10.5)
Chloride: 102 mEq/L (ref 96–112)
Creatinine, Ser: 0.71 mg/dL (ref 0.50–1.10)
GFR calc Af Amer: >90 mL/min (ref >90)
Glucose, Bld: 119 mg/dL — ABNORMAL HIGH (ref 70–99)
POTASSIUM: 3.8 meq/L (ref 3.7–5.3)
SODIUM: 138 meq/L (ref 137–147)
Total Bilirubin: 0.7 mg/dL (ref 0.3–1.2)
Total Protein: 6.9 g/dL (ref 6.0–8.3)

## 2014-05-06 LAB — CBC
HCT: 37.1 % (ref 36.0–46.0)
HEMOGLOBIN: 12 g/dL (ref 12.0–15.0)
MCH: 28.4 pg (ref 26.0–34.0)
MCHC: 32.3 g/dL (ref 30.0–36.0)
MCV: 87.7 fL (ref 78.0–100.0)
PLATELETS: 451 10*3/uL — AB (ref 150–400)
RBC: 4.23 MIL/uL (ref 3.87–5.11)
RDW: 14.5 % (ref 11.5–15.5)
WBC: 17.3 10*3/uL — AB (ref 4.0–10.5)

## 2014-05-06 LAB — RAPID URINE DRUG SCREEN, HOSP PERFORMED
AMPHETAMINES: NOT DETECTED
Barbiturates: NOT DETECTED
Benzodiazepines: POSITIVE — AB
Cocaine: POSITIVE — AB
Opiates: POSITIVE — AB
TETRAHYDROCANNABINOL: POSITIVE — AB

## 2014-05-06 LAB — PREGNANCY, URINE: PREG TEST UR: NEGATIVE

## 2014-05-06 LAB — ETHANOL: Alcohol, Ethyl (B): <11 mg/dL (ref 0–11)

## 2014-05-06 LAB — ACETAMINOPHEN LEVEL

## 2014-05-06 LAB — SALICYLATE LEVEL

## 2014-05-06 MED ORDER — SODIUM CHLORIDE 0.9 % IV BOLUS (SEPSIS)
1000.0000 mL | INTRAVENOUS | Status: AC
  Administered 2014-05-06: 1000 mL via INTRAVENOUS

## 2014-05-06 MED ORDER — CLONIDINE HCL 0.1 MG PO TABS
0.2000 mg | ORAL_TABLET | Freq: Once | ORAL | Status: AC
  Administered 2014-05-06: 0.2 mg via ORAL
  Filled 2014-05-06: qty 2

## 2014-05-06 NOTE — ED Notes (Signed)
MD at bedside. 

## 2014-05-06 NOTE — ED Notes (Signed)
PT NOT IN ROOM. IV BAG HANGING WITH PRIMARY TUBING ATTACHED. ASSUMPTION PT HAS IV STILL IN RIGHT HAND. PT'S BELONGS NOT IN ROOM. ATTEMPTS MADE BY TODD NT AND SHAKIRAH NT TO FIND PT WITHOUT SUCCESS. EDP HARRISON AND CHARGE RN FAITH MARIE AWARE OF PT'S CURRENT STATUS. [employer] AND SECURITY MADE AWARE TO MAKE GOOD FAITH EFFORT TO LOCATE PT.

## 2014-05-06 NOTE — ED Provider Notes (Addendum)
CSN: [id number]     Arrival date & time 05/06/14  0150 History   First MD Initiated Contact with Patient 05/06/14 [phone]     Chief Complaint  Patient presents with  . Addiction Problem     (Consider location/radiation/quality/duration/timing/severity/associated sxs/prior Treatment) Patient is a 23 y.o. female presenting with drug problem. The history is provided by the patient.  Drug Problem This is a chronic problem. The current episode started more than 1 week ago. The problem occurs constantly. The problem has not changed since onset.Nothing aggravates the symptoms. Nothing relieves the symptoms. She has tried nothing for the symptoms. The treatment provided no relief.    Past Medical History  Diagnosis Date  . H/O bladder infections   . Mastitis   . Renal disorder    Past Surgical History  Procedure Laterality Date  . Cesarean section    . Tooth extraction     No family history on file. History  Substance Use Topics  . Smoking status: Current Every Day Smoker  . Smokeless tobacco: Not on file  . Alcohol Use: Yes     Comment: occ   OB History   Grav Para Term Preterm Abortions TAB SAB Ect Mult Living                 Review of Systems  Constitutional: Positive for chills and diaphoresis. Negative for fever and fatigue.  HENT: Negative for congestion and drooling.   Eyes: Negative for pain.  Respiratory: Negative for cough.   Gastrointestinal: Positive for nausea. Negative for vomiting and diarrhea.  Genitourinary: Negative for dysuria and hematuria.  Musculoskeletal: Positive for myalgias. Negative for back pain, gait problem and neck pain.  Skin: Negative for color change.  Neurological: Negative for dizziness.  Hematological: Negative for adenopathy.  Psychiatric/Behavioral: Negative for behavioral problems.  All other systems reviewed and are negative.     Allergies  Aspirin and Bactrim  Home Medications   Prior to Admission medications   Not on File    BP 100/48  Pulse 102  Temp(Src) 98.7 F (37.1 C) (Oral)  Resp 18  Ht 5\' 2"  (1.575 m)  Wt 125 lb (56.7 kg)  BMI 22.86 kg/m2  SpO2 97%  LMP 05/04/2014 Physical Exam  Nursing note and vitals reviewed. Constitutional: She is oriented to person, place, and time. She appears well-developed and well-nourished.  HENT:  Head: Normocephalic.  Mouth/Throat: Oropharynx is clear and moist. No oropharyngeal exudate.  Eyes: Conjunctivae and EOM are normal. Pupils are equal, round, and reactive to light.  Neck: Normal range of motion. Neck supple.  Cardiovascular: Regular rhythm, normal heart sounds and intact distal pulses.  Exam reveals no gallop and no friction rub.   No murmur heard. Pulmonary/Chest: Effort normal and breath sounds normal. No respiratory distress. She has no wheezes.  Abdominal: Soft. Bowel sounds are normal. There is no tenderness. There is no rebound and no guarding.  Musculoskeletal: Normal range of motion. She exhibits no edema and no tenderness.  Neurological: She is alert and oriented to person, place, and time.  Skin: Skin is warm. She is diaphoretic (mild).  Psychiatric: She has a normal mood and affect. Her behavior is normal.  Mildly restless.     ED Course  Procedures (including critical care time) Labs Review Labs Reviewed  CBC - Abnormal; Notable for the following:    WBC 17.3 (*)    Platelets 451 (*)    All other components within normal limits  COMPREHENSIVE METABOLIC PANEL -  Abnormal; Notable for the following:    Glucose, Bld 119 (*)    Albumin 3.3 (*)    Anion gap 17 (*)    All other components within normal limits  SALICYLATE LEVEL - Abnormal; Notable for the following:    Salicylate Lvl <2.0 (*)    All other components within normal limits  URINE RAPID DRUG SCREEN (HOSP PERFORMED) - Abnormal; Notable for the following:    Opiates POSITIVE (*)    Cocaine POSITIVE (*)    Benzodiazepines POSITIVE (*)    Tetrahydrocannabinol POSITIVE (*)     All other components within normal limits  ACETAMINOPHEN LEVEL  ETHANOL  PREGNANCY, URINE    Imaging Review No results found.   EKG Interpretation None      MDM   Final diagnoses:  Heroin abuse    7:11 AM 23 y.o. 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 Using for 5 years, 2grams per day. Has not used in 2 days. Does not abuse etoh. Does abuse other drugs like xanax, cocaine, marijuana but w/ not regular frequency. Denies SI to me. Denies HI. Is having sweats/chills, myalgias, nausea. No vomiting or diarrhea. Pt slightly restless on exam.   10:22 AM nursing reported to me that the patient eloped with an IV still in her hand. Original plan was to give pt IVF, clonidine, and re-evaluate.   Purvis SheffieldForrest Keaja Reaume, MD 05/06/14 [id number]  Purvis SheffieldForrest Betzayda Braxton, MD 05/06/14 [phone]

## 2014-05-06 NOTE — Progress Notes (Signed)
[facility] Community Liaison Stacy, ° °Provided pt with a list of primary care resources, [facility] Orange Card application, and information on [facility] for outpatient mental health resources.  °

## 2014-05-06 NOTE — ED Notes (Signed)
MD at bedside. EDP HARRISON PRESENT TO EVALUATE THIS PT 

## 2014-05-06 NOTE — ED Notes (Signed)
Pt arrives to the ER via EMS with complaints of all over generalized body pain [facility]; pt states that she has been using heroine for 5 years and normally uses 2 gms per day; pt states that she has not used in 2 days; pt c/o SI with no plan due to pain; pt denies HI

## 2014-05-10 ENCOUNTER — Emergency Department (HOSPITAL_COMMUNITY)
Admission: EM | Admit: 2014-05-10 | Discharge: 2014-05-10 | Disposition: A | Discharge status: Home / Self Care | Payer: Medicaid Other | Attending: Emergency Medicine | Admitting: Emergency Medicine

## 2014-05-10 ENCOUNTER — Encounter (HOSPITAL_COMMUNITY): Payer: Self-pay | Admitting: Emergency Medicine

## 2014-05-10 DIAGNOSIS — K088 Other specified disorders of teeth and supporting structures: Secondary | ICD-10-CM | POA: Insufficient documentation

## 2014-05-10 DIAGNOSIS — K0889 Other specified disorders of teeth and supporting structures: Secondary | ICD-10-CM

## 2014-05-10 DIAGNOSIS — Z720 Tobacco use: Secondary | ICD-10-CM | POA: Insufficient documentation

## 2014-05-10 DIAGNOSIS — Z87440 Personal history of urinary (tract) infections: Secondary | ICD-10-CM | POA: Insufficient documentation

## 2014-05-10 DIAGNOSIS — Z8742 Personal history of other diseases of the female genital tract: Secondary | ICD-10-CM | POA: Insufficient documentation

## 2014-05-10 DIAGNOSIS — Z87448 Personal history of other diseases of urinary system: Secondary | ICD-10-CM | POA: Insufficient documentation

## 2014-05-10 MED ORDER — IBUPROFEN 600 MG PO TABS: 600.0000 mg | ORAL_TABLET | Freq: Three times a day (TID) | ORAL | Status: DC

## 2014-05-10 MED ORDER — KETOROLAC TROMETHAMINE 60 MG/2ML IM SOLN
60.0000 mg | Freq: Once | INTRAMUSCULAR | Status: AC
  Administered 2014-05-10: 60 mg via INTRAMUSCULAR
  Filled 2014-05-10: qty 2

## 2014-05-10 MED ORDER — PENICILLIN V POTASSIUM 500 MG PO TABS: 500.0000 mg | ORAL_TABLET | Freq: Four times a day (QID) | ORAL | Status: DC

## 2014-05-10 NOTE — Discharge Instructions (Signed)

## 2014-05-10 NOTE — ED Notes (Signed)
Pt. arrived with EMS from home reports left lower molar pain for 3 days .

## 2014-05-10 NOTE — ED Provider Notes (Signed)
CSN: [id number]     Arrival date & time 05/10/14  0028 History   First MD Initiated Contact with Patient 05/10/14 0405     Chief Complaint  Patient presents with  . Dental Pain     (Consider location/radiation/quality/duration/timing/severity/associated sxs/prior Treatment) HPI Patient with history of polysubstance abuse presents with left second molar tooth pain for the past 2-3 days. She is yet to see dentist. She denies any fever chills. She has no intraoral swelling. She states the pain radiates to her left jaw.  Past Medical History  Diagnosis Date  . H/O bladder infections   . Mastitis   . Renal disorder    Past Surgical History  Procedure Laterality Date  . Cesarean section    . Tooth extraction     No family history on file. History  Substance Use Topics  . Smoking status: Current Every Day Smoker  . Smokeless tobacco: Not on file  . Alcohol Use: Yes     Comment: occ   OB History   Grav Para Term Preterm Abortions TAB SAB Ect Mult Living                 Review of Systems  Constitutional: Negative for fever and chills.  HENT: Positive for dental problem. Negative for sore throat.   Gastrointestinal: Negative for nausea and vomiting.  All other systems reviewed and are negative.     Allergies  Aspirin and Bactrim  Home Medications   Prior to Admission medications   Not on File   BP 145/97  Pulse 93  Temp(Src) 99.1 F (37.3 C) (Oral)  Resp 14  SpO2 100%  LMP 05/04/2014 Physical Exam  Nursing note and vitals reviewed. Constitutional: She is oriented to person, place, and time. She appears well-developed and well-nourished. No distress.  HENT:  Head: Normocephalic and atraumatic.  Mouth/Throat: Oropharynx is clear and moist.    Neck: Normal range of motion. Neck supple.  Cardiovascular: Normal rate and regular rhythm.   Pulmonary/Chest: Effort normal.  Abdominal: Soft.  Musculoskeletal: Normal range of motion. She exhibits no edema and no  tenderness.  Lymphadenopathy:    She has no cervical adenopathy.  Neurological: She is alert and oriented to person, place, and time.  Skin: Skin is warm and dry. No rash noted. No erythema.  Psychiatric: She has a normal mood and affect. Her behavior is normal.    ED Course  Procedures (including critical care time) Labs Review Labs Reviewed - No data to display  Imaging Review No results found.   EKG Interpretation None      MDM   Final diagnoses:  None    We'll treat with NSAIDs start on antibiotics. Patient is encouraged to followup with the dentist. Return precautions given.    Loren Raceravid Jyren Cerasoli, MD 05/10/14 [phone]

## 2014-05-17 ENCOUNTER — Encounter (HOSPITAL_COMMUNITY): Payer: Self-pay

## 2014-05-17 ENCOUNTER — Inpatient Hospital Stay (HOSPITAL_COMMUNITY)
Admission: AD | Admit: 2014-05-17 | Discharge: 2014-05-17 | Disposition: A | Discharge status: Home / Self Care | Payer: Medicaid Other | Source: Ambulatory Visit | Attending: Obstetrics & Gynecology | Admitting: Obstetrics & Gynecology

## 2014-05-17 DIAGNOSIS — R1032 Left lower quadrant pain: Secondary | ICD-10-CM

## 2014-05-17 DIAGNOSIS — Z720 Tobacco use: Secondary | ICD-10-CM | POA: Insufficient documentation

## 2014-05-17 DIAGNOSIS — N390 Urinary tract infection, site not specified: Secondary | ICD-10-CM | POA: Diagnosis present

## 2014-05-17 DIAGNOSIS — R109 Unspecified abdominal pain: Secondary | ICD-10-CM | POA: Insufficient documentation

## 2014-05-17 LAB — WET PREP, GENITAL
Clue Cells Wet Prep HPF POC: NONE SEEN
Trich, Wet Prep: NONE SEEN
Yeast Wet Prep HPF POC: NONE SEEN

## 2014-05-17 LAB — RAPID URINE DRUG SCREEN, HOSP PERFORMED
AMPHETAMINES: NOT DETECTED
Barbiturates: NOT DETECTED
Benzodiazepines: NOT DETECTED
COCAINE: NOT DETECTED
OPIATES: POSITIVE — AB
Tetrahydrocannabinol: POSITIVE — AB

## 2014-05-17 LAB — URINALYSIS, ROUTINE W REFLEX MICROSCOPIC
BILIRUBIN URINE: NEGATIVE
Glucose, UA: NEGATIVE mg/dL
Hgb urine dipstick: NEGATIVE
Ketones, ur: NEGATIVE mg/dL
Nitrite: POSITIVE — AB
PH: 7 (ref 5.0–8.0)
Protein, ur: NEGATIVE mg/dL
Specific Gravity, Urine: 1.015 (ref 1.005–1.030)
Urobilinogen, UA: 1 mg/dL (ref 0.0–1.0)

## 2014-05-17 LAB — POCT PREGNANCY, URINE: Preg Test, Ur: NEGATIVE

## 2014-05-17 LAB — URINE MICROSCOPIC-ADD ON

## 2014-05-17 MED ORDER — CEPHALEXIN 500 MG PO CAPS: 500.0000 mg | ORAL_CAPSULE | Freq: Four times a day (QID) | ORAL | Status: AC

## 2014-05-17 MED ORDER — OXYCODONE-ACETAMINOPHEN 5-325 MG PO TABS: 1.0000 | ORAL_TABLET | Freq: Four times a day (QID) | ORAL | Status: DC | PRN

## 2014-05-17 NOTE — Discharge Instructions (Signed)

## 2014-05-17 NOTE — MAU Note (Signed)
Pt presents complaining of severe lower back and left sided abdominal pain that started last night and got worse today. States she is having burning with urination and an odor to her urine. States LMP was 10/15 but states it came back 2 days ago.

## 2014-05-17 NOTE — MAU Provider Note (Signed)
Chief Complaint: Abdominal Pain, Back Pain and Vaginal Bleeding   First Provider Initiated Contact with Patient 05/17/14 1855     SUBJECTIVE HPI: Jenny Reed is a 23 y.o. G1P1001 who presents to maternity admissions reporting LLQ abdominal pain, back pain, and vaginal bleeding.  She is concerned that she may have a kidney infection.  She reports the pain started yesterday and is so severe today that she cannot walk.  She is having her period and reports this is her second period this month but her periods are often irregular.  She denies vaginal itching/burning, urinary symptoms, h/a, dizziness, n/v, or fever/chills.   Past Medical History  Diagnosis Date  . H/O bladder infections   . Mastitis   . Renal disorder    Past Surgical History  Procedure Laterality Date  . Cesarean section    . Tooth extraction     History   Social History  . Marital Status: Single    Spouse Name: N/A    Number of Children: N/A  . Years of Education: N/A   Occupational History  . Not on file.   Social History Main Topics  . Smoking status: Current Every Day Smoker    Types: Cigarettes  . Smokeless tobacco: Not on file  . Alcohol Use: Yes     Comment: occ  . Drug Use: Yes     Comment: heroine  . Sexual Activity: Yes    Birth Control/ Protection: None   Other Topics Concern  . Not on file   Social History Narrative  . No narrative on file   No current facility-administered medications on file prior to encounter.   No current outpatient prescriptions on file prior to encounter.   Allergies  Allergen Reactions  . Aspirin Other (See Comments)    Pt states that this medication causes stomach pain.    . Bactrim [Sulfamethoxazole-Tmp Ds] Hives and Swelling    ROS: Pertinent items in HPI  OBJECTIVE Blood pressure 124/84, pulse 100, temperature 98.1 F (36.7 C), temperature source Oral, resp. rate 18, last menstrual period 05/04/2014. GENERAL: Well-developed, well-nourished female  in no acute distress.  HEENT: Normocephalic HEART: normal rate RESP: normal effort ABDOMEN: Soft, non-tender, no rebound tenderness, no guarding EXTREMITIES: Nontender, no edema NEURO: Alert and oriented Musculoskeletal: Negative CVA tenderness Pelvic exam: Cervix pink, visually closed, without lesion, moderate dark red bleeding, vaginal walls and external genitalia normal Bimanual exam: Cervix 0/long/high, firm, anterior, positive CMT, uterus tender, nonenlarged, generalized tenderness throughout lower abdomen, adnexa without enlargement or mass  LAB RESULTS Results for orders placed during the hospital encounter of 05/17/14 (from the past 24 hour(s))  URINALYSIS, ROUTINE W REFLEX MICROSCOPIC     Status: Abnormal   Collection Time    05/17/14  6:30 PM      Result Value Ref Range   Color, Urine YELLOW  YELLOW   APPearance HAZY (*) CLEAR   Specific Gravity, Urine 1.015  1.005 - 1.030   pH 7.0  5.0 - 8.0   Glucose, UA NEGATIVE  NEGATIVE mg/dL   Hgb urine dipstick NEGATIVE  NEGATIVE   Bilirubin Urine NEGATIVE  NEGATIVE   Ketones, ur NEGATIVE  NEGATIVE mg/dL   Protein, ur NEGATIVE  NEGATIVE mg/dL   Urobilinogen, UA 1.0  0.0 - 1.0 mg/dL   Nitrite POSITIVE (*) NEGATIVE   Leukocytes, UA TRACE (*) NEGATIVE  URINE MICROSCOPIC-ADD ON     Status: Abnormal   Collection Time    05/17/14  6:30 PM  Result Value Ref Range   Squamous Epithelial / LPF FEW (*) RARE   WBC, UA 3-6  <3 WBC/hpf   RBC / HPF 0-2  <3 RBC/hpf   Bacteria, UA MANY (*) RARE  POCT PREGNANCY, URINE     Status: None   Collection Time    05/17/14  6:46 PM      Result Value Ref Range   Preg Test, Ur NEGATIVE  NEGATIVE  WET PREP, GENITAL     Status: Abnormal   Collection Time    05/17/14  7:10 PM      Result Value Ref Range   Yeast Wet Prep HPF POC NONE SEEN  NONE SEEN   Trich, Wet Prep NONE SEEN  NONE SEEN   Clue Cells Wet Prep HPF POC NONE SEEN  NONE SEEN   WBC, Wet Prep HPF POC FEW (*) NONE SEEN   MAU  Management: CBC and pelvic ultrasound ordered.  Pt declines both.  She reports she has called her ride and needs to leave. She cannot stay to receive pain medication in MAU.  Explained that urine results indicate UTI.  Pt requests medication prescribed so she can pick it up at pharmacy.    Pt afebrile, no n/v.  With neg CVA tenderness, unlikely kidney involvement.  Discussed with pt possibility of infection, including PID causing pain and recommend labs to evaluate this.  Pt declines further testing.   ASSESSMENT 1. LLQ abdominal pain   2. UTI (lower urinary tract infection)     PLAN Discharge home Keflex 500 mg QID x 7 days Percocet 5/325 x 10 tabs  Return to emergency room or MAU if symptoms persist or worsen    Medication List         cephALEXin 500 MG capsule  Commonly known as:  KEFLEX  Take 1 capsule (500 mg total) by mouth 4 (four) times daily.     oxyCODONE-acetaminophen 5-325 MG per tablet  Commonly known as:  PERCOCET/ROXICET  Take 1-2 tablets by mouth every 6 (six) hours as needed.           Follow-up Information   Follow up with [facility]. (As needed for emergencies)    Contact information:   [address] [place] [id number]       [facility] with [facility]. (As needed for Gyn emergencies)    Contact information:   [address] [id number] [facility] [place] [address] [phone]      Sharen CounterLisa Leftwich-Kirby Certified Nurse-Midwife 05/17/2014  8:08 PM

## 2014-05-19 LAB — GC/CHLAMYDIA PROBE AMP
CT PROBE, AMP APTIMA: NEGATIVE
GC Probe RNA: NEGATIVE

## 2014-05-26 ENCOUNTER — Encounter (HOSPITAL_COMMUNITY): Payer: Self-pay

## 2014-06-22 ENCOUNTER — Encounter (HOSPITAL_COMMUNITY): Payer: Self-pay | Admitting: Emergency Medicine

## 2014-06-22 ENCOUNTER — Emergency Department (HOSPITAL_COMMUNITY)
Admission: EM | Admit: 2014-06-22 | Discharge: 2014-06-22 | Disposition: A | Discharge status: Home / Self Care | Payer: Medicaid Other | Attending: Emergency Medicine | Admitting: Emergency Medicine

## 2014-06-22 DIAGNOSIS — Z87440 Personal history of urinary (tract) infections: Secondary | ICD-10-CM | POA: Insufficient documentation

## 2014-06-22 DIAGNOSIS — Z8742 Personal history of other diseases of the female genital tract: Secondary | ICD-10-CM | POA: Insufficient documentation

## 2014-06-22 DIAGNOSIS — Z720 Tobacco use: Secondary | ICD-10-CM | POA: Insufficient documentation

## 2014-06-22 DIAGNOSIS — R6884 Jaw pain: Secondary | ICD-10-CM | POA: Insufficient documentation

## 2014-06-22 DIAGNOSIS — K029 Dental caries, unspecified: Secondary | ICD-10-CM | POA: Insufficient documentation

## 2014-06-22 MED ORDER — BUPIVACAINE HCL 0.5 % IJ SOLN
50.0000 mL | Freq: Once | INTRAMUSCULAR | Status: AC
Start: 2014-06-22 — End: 2014-06-22
  Administered 2014-06-22: 50 mL
  Filled 2014-06-22: qty 50

## 2014-06-22 NOTE — ED Notes (Signed)
Pt crying loudly stating that her jaw is hurting from her teeth. Started 2 days ago. Denies any injury.

## 2014-06-22 NOTE — ED Provider Notes (Signed)
CSN: [id number]     Arrival date & time 06/22/14  [id number] History   First MD Initiated Contact with Patient 06/22/14 [phone]     Chief Complaint  Patient presents with  . Dental Pain  . Jaw Pain     (Consider location/radiation/quality/duration/timing/severity/associated sxs/prior Treatment) HPI  Patient reports 2 days of jaw pain. It is the lower left jaw that is bothering her. She reports she has a tooth in the back is broken and decayed. There is no psoas is swelling or fever. She reports the pain as constant and intolerable.  Past Medical History  Diagnosis Date  . H/O bladder infections   . Mastitis   . Renal disorder    Past Surgical History  Procedure Laterality Date  . Cesarean section    . Tooth extraction     No family history on file. History  Substance Use Topics  . Smoking status: Current Every Day Smoker    Types: Cigarettes  . Smokeless tobacco: Not on file  . Alcohol Use: Yes     Comment: occ   OB History    Gravida Para Term Preterm AB TAB SAB Ectopic Multiple Living   1 1 1       1      Review of Systems  Constitutional: No fevers no chills.  Allergies  Aspirin and Bactrim  Home Medications   Prior to Admission medications   Medication Sig Start Date End Date Taking? Authorizing Provider  oxyCODONE-acetaminophen (PERCOCET/ROXICET) 5-325 MG per tablet Take 1-2 tablets by mouth every 6 (six) hours as needed. 05/17/14   Misty StanleyLisa A Leftwich-Kirby, CNM   BP 115/86 mmHg  Pulse 72  Temp(Src) 98.2 F (36.8 C) (Oral)  Resp 18  SpO2 99%  LMP 06/19/2014 Physical Exam  Constitutional: She is oriented to person, place, and time.  The patient is well appearing. She has no respiratory distress. Her color is good. She is crying continuously.  HENT:  Head: Normocephalic and atraumatic.  Mouth/Throat:    There is no facial swelling. There is normal range of motion at the temporomandibular joint.   Eyes: EOM are normal. Pupils are equal, round, and reactive  to light. Right eye exhibits no discharge. Left eye exhibits no discharge.  Neck: Neck supple.  Patient has normal range of motion of the neck is supple without lymphadenopathy or meningismus. The patient does have several petechial bruises on the left side of her neck consistent with "Hickies"  Pulmonary/Chest: Effort normal.  Neurological: She is alert and oriented to person, place, and time.  Skin: Skin is warm and dry.  Psychiatric:  The patient's crying and tearfulness is disproportionate to the findings.    ED Course  Procedures (including critical care time) Labs Review Labs Reviewed - No data to display  Imaging Review No results found.   EKG Interpretation None     Procedure: Left Inferior alveolar dental block. 2 mL of Marcaine 0.5%. Patient tolerate procedure well. MDM   Final diagnoses:  Dental caries   Patient has chronic dental decay. At this point time there is no facial swelling. There is no associated drainage or discharge around the tooth. She is reporting severe pain in her lower posterior molar and the approximately second incisor to first molar. Dental block was provided for pain relief. The patient is advised to follow-up with the dental clinic.    Arby BarretteMarcy Eliyah Mcshea, MD 06/22/14 1017

## 2014-06-22 NOTE — Discharge Instructions (Signed)
Dental Caries °Dental caries is tooth decay. This decay can cause a hole in teeth (cavity) that can get bigger and deeper over time. °HOME CARE °· Brush and floss your teeth. Do this at least two times a day. °· Use a fluoride toothpaste. °· Use a mouth rinse if told by your dentist or doctor. °· Eat less sugary and starchy foods. Drink less sugary drinks. °· Avoid snacking often on sugary and starchy foods. Avoid sipping often on sugary drinks. °· Keep regular checkups and cleanings with your dentist. °· Use fluoride supplements if told by your dentist or doctor. °· Allow fluoride to be applied to teeth if told by your dentist or doctor. °Document Released: 04/19/2008 Document Revised: 11/25/2013 Document Reviewed: 07/13/2012 °ExitCare® Patient Information ©2015 [employer]. This information is not intended to replace advice given to you by your health care provider. Make sure you discuss any questions you have with your health care provider. ° °Emergency Department Resource Guide °1) Find a Doctor and Pay Out of Pocket °Although you won't have to find out who is covered by your insurance plan, it is a good idea to ask around and get recommendations. You will then need to call the office and see if the doctor you have chosen will accept you as a new patient and what types of options they offer for patients who are self-pay. Some doctors offer discounts or will set up payment plans for their patients who do not have insurance, but you will need to ask so you aren't surprised when you get to your appointment. ° °2) Contact Your Local Health Department °Not all health departments have doctors that can see patients for sick visits, but many do, so it is worth a call to see if yours does. If you don't know where your local health department is, you can check in your phone book. The CDC also has a tool to help you locate your state's health department, and many state websites also have listings of all of their local  health departments. ° °3) Find a Walk-in Clinic °If your illness is not likely to be very severe or complicated, you may want to try a walk in clinic. These are popping up all over the country in pharmacies, drugstores, and shopping centers. They're usually staffed by nurse practitioners or physician assistants that have been trained to treat common illnesses and complaints. They're usually fairly quick and inexpensive. However, if you have serious medical issues or chronic medical problems, these are probably not your best option. ° °No Primary Care Doctor: °- Call [facility] at  832-8000 - they can help you locate a primary care doctor that  accepts your insurance, provides certain services, etc. °- Physician Referral Service- 1-800-533-3463 ° °Chronic Pain Problems: °Organization         Address  Phone   Notes  °[facility]  (336) 297-2271 Patients need to be referred by their primary care doctor.  ° °Medication Assistance: °Organization         Address  Phone   Notes  °[place] [facility] [address] °[place], [place] [address] (336) 641-8030 --Must be a resident of [place] °-- Must have NO insurance coverage whatsoever (no Medicaid/ Medicare, etc.) °-- The pt. MUST have a primary care doctor that directs their care regularly and follows them in the community °  °[facility]  (866) 331-1348   °[employer]  (888) 892-1162   ° °Agencies that   provide inexpensive medical care: °Organization         Address  Phone   Notes  °[facility]  (336) 832-8035   °[facility]    (336) 832-7272   °[facility] [address] °[place], [place] [address] (336) 832-4777   °[facility] [address], °[place] (336) 271-4999   °[facility]    (336) 373-0678   °[facility]    (336) 272-1050   °[facility] ° [address], [place] Phone:   (336) 832-4444, Fax:  (336) 832-4440 Hours of Operation:  9 am - 6 pm, M-F.  Also accepts Medicaid/Medicare and self-pay.  °[facility] ° [address], [place] Phone: (336) 832-3150, Fax: (336) 832-3151. Hours of Operation:  8:30 am - 5:30 pm, M-F.  Also accepts Medicaid and self-pay.  °[facility] [place] [address], [place] Phone: (336) 878-6027   °[facility] [address], [place], [place] (336)723-1848, Ext. 123 Mondays & Thursdays: 7-9 AM.  First 15 patients are seen on a first come, first serve basis. °  ° °Medicaid-accepting [place] Providers: ° °Organization         Address  Phone   Notes  °[facility] [address], [place] (336) 641-2100 Also accepts self-pay patients.  °[facility] [address], [place] ° (336) 856-9996   °[facility] [address], [place] (336) 288-8857   °[facility] [address] [place] [address], [place] (336) 299-7000   °Veita Bland [address], [place]  ° (336) 373-1557 Only accepts [place] Access Medicaid patients after they have their name applied to their card.  ° °Self-Pay (no insurance) in [place]: ° °Organization         Address  Phone   Notes  °Sickle Cell Patients, [facility] [address], [place] (336) 832-1970   °[facility] [address], [place] (336) 832-4400   °[facility] ° [address], [place] (336) 992-4800   °[facility]/Dr. Osei-Bonsu ° [address] [place] [address], [place] or [address], [place] (336) 841-8500 Phone number for both [place] and [place] locations is the same.  °[facility] [address], [place] (336) 299-0000   °[facility] [address] [place] [address], [place] or [address] (336)  852-7530 °(336) 878-2260   °[facility] [address], [place] (336) 350-1642, phone; (336) 294-5005, fax Sees patients 1st and 3rd Saturday of every month.  Must not qualify for public or private insurance (i.e. Medicaid, Medicare, [place] Health Choice, Veterans' Benefits) • Household income should be no more than 200% of the poverty level •The clinic cannot treat you if you are pregnant or think you are pregnant • Sexually transmitted diseases are not treated at the clinic.  ° ° °Dental Care: °Organization         Address  Phone  Notes  °[facility] [facility] [address], [place] (336) 641-6152 Accepts children up to age 21 who are enrolled in Medicaid or [place] Health Choice; pregnant women with a Medicaid card; and children who have applied for Medicaid or [place] Health   Choice, but were declined, whose parents can pay a reduced fee at time of service.  °[facility] [place]  [address], [place] (336) 641-7733 Accepts children up to age 21 who are enrolled in Medicaid or [place] Health Choice; pregnant women with a Medicaid card; and children who have applied for Medicaid or [place] Health Choice, but were declined, whose parents can pay a reduced fee at time of service.  °[facility] ° [address], [place] (336) 641-4533 Patients are seen by appointment only. Walk-ins are not accepted. [facility] will see patients 18 years of age and older. °Monday - Tuesday (8am-5pm) °Most Wednesdays (8:30-5pm) °$30 per visit, cash only  °[facility] ° [address], [place] (336) 641-4533 Patients are seen by appointment only. Walk-ins are not accepted. [facility] will see patients 18 years of age and older. °One Wednesday Evening (Monthly: Volunteer Based).  $30 per visit, cash only  °[facility]  (919) 537-3737 for adults;  Children under age 4, call Graduate Pediatric Dentistry at (919) [phone]. Children aged 4-14, please call (919) 537-3737 to request a pediatric application. ° Dental services are provided in all areas of dental care including fillings, crowns and bridges, complete and partial dentures, implants, gum treatment, root canals, and extractions. Preventive care is also provided. Treatment is provided to both adults and children. °Patients are selected via a lottery and there is often a waiting list. °  °[facility] [address], °[place] ° (336) 763-8833 [email or web address] °  °[facility] [address], [place], [place] (336)723-1848, Ext. 123 Second and Fourth Thursday of each month, opens at 6:30 AM; Clinic ends at 9 AM.  Patients are seen on a first-come first-served basis, and a limited number are seen during each clinic.  ° °[facility] ° [address], [place], [place] (336) 723-7904   Eligibility Requirements °You must have lived in [place], [place], or [place] counties for at least the last three months. °  You cannot be eligible for state or federal sponsored healthcare insurance, including Veterans Administration, Medicaid, or Medicare. °  You generally cannot be eligible for healthcare insurance through your employer.  °  How to apply: °Eligibility screenings are held every Tuesday and Wednesday afternoon from 1:00 pm until 4:00 pm. You do not need an appointment for the interview!  °[facility] [address], [place], [place] 336-631-2330   °[facility]  336-342-8273   °[facility]  336-703-3100   °[facility]  336-570-6415   ° °Behavioral Health Resources in the Community: °Intensive Outpatient Programs °Organization         Address  Phone  Notes  °[facility] [address], [place], [place] 336-878-6098   °[facility] [address], [place], [place] 336-832-9800   °[facility] [address], [place], [place] ° 336-882-2125   °[facility] [address],  °[place], [place] 1-800-853-5163 or 336-641-4981   °Substance Abuse Resources °Organization         Address  Phone  Notes  °[facility]  336-882-2125   °[facility]  336-784-9470   °[facility]  336-285-9073   °[facility]  336-845-3988   °Residential & Outpatient Substance Abuse Program  1-800-659-3381   °  Psychological Services °Organization         Address  Phone  Notes  °[facility]  336- 832-9600   °[facility]  336- 378-7881   °[facility] [address], [place] 1-800-853-5163 or 336-641-4981   ° °Mobile Crisis Teams °Organization         Address  Phone  Notes  °[facility], Mobile Crisis Care Unit  1-877-626-1772   °Assertive °[facility] ° [address] [place], [place] 336-834-9664   °Sharon DeEsch [address] °[place] 336-554-5454   ° °Self-Help/Support Groups °Organization         Address  Phone             Notes  °[facility] [place] [facility] - variety of support groups  336- 373-1402 Call for more information  °Narcotics Anonymous (NA), [facility] [address], °[place]  2 meetings at this location  ° °Residential Treatment Programs °Organization         Address  Phone  Notes  °[facility] [address],    °[place]  1-866-801-8205   °[facility] ° [address], [place], [place] 704-293-8524   °[facility] [address], [place] 336-845-3988 Admissions: 8am-3pm M-F  °[facility] [address]    °[place], [place] 336-841-1104   °[facility] [address], [place], [place] 336-379-7146   °[facility] [address]  °[place], [place] 336-285-9073   °[facility] - Intensive  Outpatient [address], [place], [place] 336-852-3033   °[facility] [address]  °[place], [place] 1-877-615-2722 or 336-784-9470   °[facility] [address] [place], [place] 336-227-7417 Accepts Medicaid  °[facility] [address]  °[place] 1-800-659-3381 Substance Abuse/Addiction Treatment  ° °[place] Behavioral Health Resources °Organization         Address  Phone  Notes  °[facility]  (888) 581-9988   °Julie Brannon, PhD [address] [place], [place]   (336) 349-5553 or (336) 951-0000   °[facility]   [address] °[place], [place] (336) 349-4454   °[facility] [address], [place], [place] (336) 342-8316 Insurance/Medicaid/sponsorship through [facility]  °[facility] [address]                                    [place], [place] (336) 342-8316 Therapy/tele-psych/case  °[facility] [address]  ° [place], [place] (336) 349-2233    °Dr. Arfeen  (336) 349-4544   °[facility] [place]  [employer] [place] [facility] 1) [address], [place] °2) [address], [place] °3)  [address], [place] (336) 349-3220 °(336) 342-7768 ° °(336) 342-8140   °[place] [facility] (336) 342-1394 or (336) 342-3537 (After Hours)    ° ° ° °

## 2014-09-07 ENCOUNTER — Emergency Department (HOSPITAL_COMMUNITY)
Admission: EM | Admit: 2014-09-07 | Discharge: 2014-09-07 | Discharge status: Against Medical Advice | Payer: Self-pay | Attending: Emergency Medicine | Admitting: Emergency Medicine

## 2014-09-07 ENCOUNTER — Encounter (HOSPITAL_COMMUNITY): Payer: Self-pay | Admitting: Emergency Medicine

## 2014-09-07 DIAGNOSIS — N390 Urinary tract infection, site not specified: Secondary | ICD-10-CM | POA: Insufficient documentation

## 2014-09-07 DIAGNOSIS — Z720 Tobacco use: Secondary | ICD-10-CM | POA: Insufficient documentation

## 2014-09-07 DIAGNOSIS — R300 Dysuria: Secondary | ICD-10-CM | POA: Insufficient documentation

## 2014-09-07 DIAGNOSIS — R000 Tachycardia, unspecified: Secondary | ICD-10-CM | POA: Insufficient documentation

## 2014-09-07 DIAGNOSIS — Z3202 Encounter for pregnancy test, result negative: Secondary | ICD-10-CM | POA: Insufficient documentation

## 2014-09-07 DIAGNOSIS — Z8742 Personal history of other diseases of the female genital tract: Secondary | ICD-10-CM | POA: Insufficient documentation

## 2014-09-07 HISTORY — DX: Calculus of kidney: N20.0

## 2014-09-07 LAB — RAPID URINE DRUG SCREEN, HOSP PERFORMED
AMPHETAMINES: NOT DETECTED
BENZODIAZEPINES: NOT DETECTED
Barbiturates: NOT DETECTED
Cocaine: NOT DETECTED
OPIATES: POSITIVE — AB
TETRAHYDROCANNABINOL: NOT DETECTED

## 2014-09-07 LAB — URINALYSIS, ROUTINE W REFLEX MICROSCOPIC
Bilirubin Urine: NEGATIVE
GLUCOSE, UA: NEGATIVE mg/dL
KETONES UR: NEGATIVE mg/dL
Nitrite: POSITIVE — AB
PH: 8 (ref 5.0–8.0)
Protein, ur: NEGATIVE mg/dL
Specific Gravity, Urine: 1.011 (ref 1.005–1.030)
Urobilinogen, UA: 0.2 mg/dL (ref 0.0–1.0)

## 2014-09-07 LAB — WET PREP, GENITAL
Clue Cells Wet Prep HPF POC: NONE SEEN
TRICH WET PREP: NONE SEEN
WBC, Wet Prep HPF POC: NONE SEEN
Yeast Wet Prep HPF POC: NONE SEEN

## 2014-09-07 LAB — URINE MICROSCOPIC-ADD ON

## 2014-09-07 LAB — POC URINE PREG, ED: Preg Test, Ur: NEGATIVE

## 2014-09-07 MED ORDER — SODIUM CHLORIDE 0.9 % IV BOLUS (SEPSIS)
1000.0000 mL | Freq: Once | INTRAVENOUS | Status: AC
  Administered 2014-09-07: 1000 mL via INTRAVENOUS

## 2014-09-07 MED ORDER — MORPHINE SULFATE 4 MG/ML IJ SOLN
6.0000 mg | Freq: Once | INTRAMUSCULAR | Status: AC
  Administered 2014-09-07: 6 mg via INTRAVENOUS
  Filled 2014-09-07: qty 2

## 2014-09-07 MED ORDER — DEXTROSE 5 % IV SOLN
1.0000 g | Freq: Once | INTRAVENOUS | Status: AC
  Administered 2014-09-07: 1 g via INTRAVENOUS
  Filled 2014-09-07: qty 10

## 2014-09-07 MED ORDER — KETOROLAC TROMETHAMINE 30 MG/ML IJ SOLN
30.0000 mg | Freq: Once | INTRAMUSCULAR | Status: AC
  Administered 2014-09-07: 30 mg via INTRAVENOUS
  Filled 2014-09-07: qty 1

## 2014-09-07 NOTE — ED Notes (Signed)
Spoke with pts mother Ms Reymundo PollHatfield. Due to not revealing why pt was here, mother rude and hung up. [employer] office aware.

## 2014-09-07 NOTE — ED Notes (Signed)
Bed: [id number] Expected date: 09/07/14 Expected time: 9:25 AM Means of arrival: Ambulance Comments: Back and bladder pain

## 2014-09-07 NOTE — ED Notes (Signed)
Pt left without telling anyone after pt given morphine without removing IV. PA informed. Security and [employer] looking for pt to remove IV.

## 2014-09-07 NOTE — ED Notes (Signed)
Per ptar. Pt here for dysuria, dark malodorous urine for 3 days. Hx of kidney stones and UTIs.

## 2014-09-07 NOTE — ED Notes (Signed)
Per ptar. Pt reports dysuria, dark urine and urine odor for last 3 days. Pt ambulating to restroom at present.

## 2014-09-07 NOTE — ED Provider Notes (Signed)
CSN: [id number]     Arrival date & time 09/07/14  4696 History   First MD Initiated Contact with Patient 09/07/14 [phone]     Chief Complaint  Patient presents with  . Dysuria     (Consider location/radiation/quality/duration/timing/severity/associated sxs/prior Treatment) HPI   24 year old female with history of recurrent urinary tract infection who presents for evaluation of low abdominal pain and dysuria. Patient states yesterday she noticed some burning urination however this morning she reported having sharp cramping lower abdominal pain, urinary frequency and urgency, and malodorous urine with some vaginal discharge. Patient states symptoms are similar to prior urinary tract infection. She reports having UTI once every 2 months with last UTI 2 months ago. She denies any fever, nausea or vomiting diarrhea, hematuria, or vaginal bleeding. She denies any prior history of STD. She does have a history of polysubstance abuse. History of heroin abuse.  Past Medical History  Diagnosis Date  . H/O bladder infections   . Mastitis   . Renal disorder    Past Surgical History  Procedure Laterality Date  . Cesarean section    . Tooth extraction     History reviewed. No pertinent family history. History  Substance Use Topics  . Smoking status: Current Every Day Smoker    Types: Cigarettes  . Smokeless tobacco: Not on file  . Alcohol Use: Yes     Comment: occ   OB History    Gravida Para Term Preterm AB TAB SAB Ectopic Multiple Living   Review of Systems  All other systems reviewed and are negative.     Allergies  Aspirin and Bactrim  Home Medications   Prior to Admission medications   Medication Sig Start Date End Date Taking? Authorizing Provider  acetaminophen (TYLENOL) 500 MG tablet Take 1,000 mg by mouth every 6 (six) hours as needed for moderate pain.   Yes Historical Provider, MD   BP 135/80 mmHg  Pulse 122  Temp(Src) 98 F (36.7 C) (Oral)  Resp  18  SpO2 100% Physical Exam  Constitutional: She appears well-developed and well-nourished. No distress (Uncomfortable but nontoxic in appearance).  HENT:  Head: Atraumatic.  Mouth/Throat: Oropharynx is clear and moist.  Eyes: Conjunctivae are normal.  Neck: Neck supple.  Cardiovascular:  Tachycardia without murmur rubs or gallops  Pulmonary/Chest: Effort normal and breath sounds normal.  Abdominal: Soft. There is tenderness (Suprapubic tenderness to palpation, no guarding or rebound tenderness.).  CVA tenderness bilaterally  Genitourinary:  Chaperone present:  No inguinal lymphadenopathy. A mobile nodule noted to left inguinal along a well-healing surgical scar, nontender to palpation. No obvious hernia noted. Normal external genitalia. Mild discomfort with insertion of speculum. Moderate white discharge noted in vaginal vault, cervical os is visualized and closed. On bimanual examination, tenderness of both left and right adnexal without cervical motion tenderness. No masses noted.  Neurological: She is alert.  Skin: No rash noted.  Psychiatric: She has a normal mood and affect.  Nursing note and vitals reviewed.   ED Course  Procedures (including critical care time)  Patient presents with dysuria. She is tachycardic with heart rate of 122 and has lower abdominal pain. Pelvic examination without cervical motion tenderness. UA with evidence of urinary tract infection. Pain medication, IV fluid, and antibiotic started.  1:36 PM Pt received abx, and IVF but left AMA with IV port access in place.  Pt with hx of heroine abuse.  Security was  notified and [employer] will be looking for pt to remove IV.    Labs Review Labs Reviewed  URINALYSIS, ROUTINE W REFLEX MICROSCOPIC - Abnormal; Notable for the following:    APPearance CLOUDY (*)    Hgb urine dipstick TRACE (*)    Nitrite POSITIVE (*)    Leukocytes, UA MODERATE (*)    All other components within normal limits  URINE RAPID DRUG SCREEN  (HOSP PERFORMED) - Abnormal; Notable for the following:    Opiates POSITIVE (*)    All other components within normal limits  URINE MICROSCOPIC-ADD ON - Abnormal; Notable for the following:    Bacteria, UA MANY (*)    All other components within normal limits  WET PREP, GENITAL  RPR  HIV ANTIBODY (ROUTINE TESTING)  POC URINE PREG, ED  GC/CHLAMYDIA PROBE AMP ([place])    Imaging Review No results found.   EKG Interpretation None      MDM   Final diagnoses:  UTI (lower urinary tract infection)  Tachycardia    BP 125/77 mmHg  Pulse 125  Temp(Src) 98 F (36.7 C) (Oral)  Resp 18  SpO2 100%     Fayrene HelperBowie Laisa Larrick, PA-C 09/07/14 1338  Nathan R. Rubin PayorPickering, MD 09/07/14 1620

## 2014-09-08 LAB — GC/CHLAMYDIA PROBE AMP (~~LOC~~) NOT AT ARMC
Chlamydia: NEGATIVE
NEISSERIA GONORRHEA: NEGATIVE

## 2014-09-08 LAB — RPR: RPR: NONREACTIVE

## 2014-09-09 LAB — HIV ANTIBODY (ROUTINE TESTING W REFLEX): HIV Screen 4th Generation wRfx: NONREACTIVE

## 2015-01-27 ENCOUNTER — Emergency Department (HOSPITAL_COMMUNITY)
Admission: EM | Admit: 2015-01-27 | Discharge: 2015-01-27 | Disposition: A | Discharge status: Home / Self Care | Payer: Medicaid Other | Attending: Emergency Medicine | Admitting: Emergency Medicine

## 2015-01-27 ENCOUNTER — Encounter (HOSPITAL_COMMUNITY): Payer: Self-pay | Admitting: *Deleted

## 2015-01-27 DIAGNOSIS — Z720 Tobacco use: Secondary | ICD-10-CM | POA: Insufficient documentation

## 2015-01-27 DIAGNOSIS — Z87448 Personal history of other diseases of urinary system: Secondary | ICD-10-CM | POA: Insufficient documentation

## 2015-01-27 DIAGNOSIS — Z87442 Personal history of urinary calculi: Secondary | ICD-10-CM | POA: Insufficient documentation

## 2015-01-27 DIAGNOSIS — L02511 Cutaneous abscess of right hand: Secondary | ICD-10-CM | POA: Insufficient documentation

## 2015-01-27 DIAGNOSIS — L0291 Cutaneous abscess, unspecified: Secondary | ICD-10-CM

## 2015-01-27 DIAGNOSIS — Z8742 Personal history of other diseases of the female genital tract: Secondary | ICD-10-CM | POA: Insufficient documentation

## 2015-01-27 MED ORDER — LIDOCAINE-EPINEPHRINE (PF) 2 %-1:200000 IJ SOLN: 10.0000 mL | Freq: Once | INTRAMUSCULAR | Status: DC

## 2015-01-27 MED ORDER — CLINDAMYCIN HCL 300 MG PO CAPS: 300.0000 mg | ORAL_CAPSULE | Freq: Three times a day (TID) | ORAL | Status: DC

## 2015-01-27 MED ORDER — ONDANSETRON 4 MG PO TBDP
4.0000 mg | ORAL_TABLET | Freq: Once | ORAL | Status: AC
  Administered 2015-01-27: 4 mg via ORAL
  Filled 2015-01-27: qty 1

## 2015-01-27 MED ORDER — LIDOCAINE-EPINEPHRINE (PF) 2 %-1:200000 IJ SOLN
10.0000 mL | Freq: Once | INTRAMUSCULAR | Status: DC
  Filled 2015-01-27: qty 20

## 2015-01-27 MED ORDER — LIDOCAINE HCL (PF) 1 % IJ SOLN
30.0000 mL | Freq: Once | INTRAMUSCULAR | Status: DC
  Filled 2015-01-27: qty 30

## 2015-01-27 MED ORDER — OXYCODONE-ACETAMINOPHEN 5-325 MG PO TABS
1.0000 | ORAL_TABLET | Freq: Once | ORAL | Status: AC
  Administered 2015-01-27: 1 via ORAL
  Filled 2015-01-27: qty 1

## 2015-01-27 MED ORDER — LIDOCAINE-EPINEPHRINE 1 %-1:100000 IJ SOLN: 10.0000 mL | Freq: Once | INTRAMUSCULAR | Status: DC

## 2015-01-27 MED ORDER — LIDOCAINE-EPINEPHRINE (PF) 2 %-1:200000 IJ SOLN
10.0000 mL | Freq: Once | INTRAMUSCULAR | Status: AC
  Administered 2015-01-27: 10 mL

## 2015-01-27 MED ORDER — CLINDAMYCIN HCL 150 MG PO CAPS
300.0000 mg | ORAL_CAPSULE | Freq: Once | ORAL | Status: AC
  Administered 2015-01-27: 300 mg via ORAL
  Filled 2015-01-27: qty 2

## 2015-01-27 NOTE — ED Notes (Signed)
Pt is here with right hand abscess from IV drug use and states it has been there for 2 months.

## 2015-01-27 NOTE — ED Provider Notes (Signed)
CSN: [id number]     Arrival date & time 01/27/15  1904 History  This chart was scribed for non-physician provider Jenny Pel, PA-C, working with Jenny Bale, MD by Jenny Reed, ED Scribe. This patient was seen in room TR03C/TR03C and patient care was started at 7:54 PM.     Chief Complaint  Patient presents with  . Abscess   The history is provided by the patient. No language interpreter was used.  HPI Comments: Jenny Reed is a 24 y.o. female who presents to the Emergency Department complaining of a right hand abscess onset 2 months ago. Pt reports that the abscess is a result of IV drug use. She reports associated pain to her fingers. Pt was recently released from jail and while there she was told that they were going to incise and drain the abscess, but she was given Clindamycin instead for about 3 weeks and the abscess did not improve. She reports that she stopped taking it when released on 01/23/15 to no relief. She denies fever, chills, nausea, diarrhea, vomiting, or any other symptoms.  She reports being clean now.  PCP: No PCP Per Patient Blood pressure 130/84, pulse 105, temperature 98.1 F (36.7 C), temperature source Oral, resp. rate 18, last menstrual period 01/04/2015, SpO2 100 %.  The patient denies diaphoresis, fever, headache, weakness (general or focal), confusion, change of vision,  neck pain, dysphagia, aphagia, chest pain, shortness of breath,  back pain, abdominal pains, nausea, vomiting, diarrhea, lower extremity swelling, rash.  Past Medical History  Diagnosis Date  . H/O bladder infections   . Mastitis   . Renal disorder   . Kidney stone    Past Surgical History  Procedure Laterality Date  . Cesarean section    . Tooth extraction     No family history on file. History  Substance Use Topics  . Smoking status: Current Every Day Smoker    Types: Cigarettes  . Smokeless tobacco: Not on file  . Alcohol Use: Yes     Comment: occ   OB History     Gravida Para Term Preterm AB TAB SAB Ectopic Multiple Living   Review of Systems A complete 10 system review of systems was obtained and all systems are negative except as noted in the HPI and PMH.   Allergies  Aspirin and Bactrim  Home Medications   Prior to Admission medications   Medication Sig Start Date End Date Taking? Authorizing Provider  acetaminophen (TYLENOL) 500 MG tablet Take 1,000 mg by mouth every 6 (six) hours as needed for moderate pain.    Historical Provider, MD  clindamycin (CLEOCIN) 300 MG capsule Take 1 capsule (300 mg total) by mouth every 8 (eight) hours. 01/27/15   Jenny Hobday Neva Seat, PA-C   BP 134/88 mmHg  Pulse 106  Temp(Src) 98.4 F (36.9 C) (Oral)  Resp 18  SpO2 97%  LMP 01/04/2015  Physical Exam  Constitutional: She is oriented to person, place, and time. She appears well-developed and well-nourished.  HENT:  Head: Normocephalic and atraumatic.  Eyes: EOM are normal.  Neck: Normal range of motion. Neck supple.  Cardiovascular: Normal rate.   Pulmonary/Chest: Effort normal.  Musculoskeletal: Normal range of motion.  Neurological: She is alert and oriented to person, place, and time.  Skin: Skin is warm and dry.  4x3 cm abscess to central dorsal aspect of right hand. Localized erythema around abscess, no streaking. No edema,  FROM of all 5 fingers, radial pulses are symmetrical. No pain to skin around the abscess.     Psychiatric: She has a normal mood and affect. Her behavior is normal.  Nursing note and vitals reviewed.   ED Course  Procedures (including critical care time) DIAGNOSTIC STUDIES: Oxygen Saturation is 97% on RA, normal by my interpretation.    COORDINATION OF CARE: 7:59 PM-Discussed treatment plan which includes I&D and anti-biotics with pt at bedside and pt agreed to plan.   INCISION AND DRAINAGE PROCEDURE NOTE: Patient identification was confirmed and verbal consent was obtained. This procedure was performed  by Jenny Peliffany Layth Cerezo, PA-C at 8:36 PM. Site: central dorsal aspect of right hand Sterile procedures observed Needle size: 25 gauge Anesthetic used (type and amt): 2% lidocaine with epi Blade size: 11 Drainage: purulent and bloody drainage.  Complexity: Complex Packing used Site anesthetized, incision made over site, wound drained and explored loculations, rinsed with copious amounts of normal saline, wound packed with sterile gauze, covered with dry, sterile dressing.  Pt tolerated procedure well without complications.  Instructions for care discussed verbally and pt provided with additional written instructions for homecare and f/u.  Labs Review Labs Reviewed - No data to display  Imaging Review No results found.   EKG Interpretation None      MDM   Final diagnoses:  Abscess   Patient covered with Clindamycin, coupon given so it costs $20. Packing left in wound- due to the nature of the abscess being due to IV drug use I have asked her to return in two days for packing removal and allow a wound recheck here or in the Urgent Care.  She has had no symptoms of nausea, vomiting, diarrhea, fatigue, fevers, weakness, headaches, lethargy.  Medications  lidocaine-EPINEPHrine (XYLOCAINE W/EPI) 2 %-1:200000 (PF) injection 10 mL (10 mLs Other Given 01/27/15 2108)  oxyCODONE-acetaminophen (PERCOCET/ROXICET) 5-325 MG per tablet 1 tablet (1 tablet Oral Given 01/27/15 2121)  ondansetron (ZOFRAN-ODT) disintegrating tablet 4 mg (4 mg Oral Given 01/27/15 2120)  clindamycin (CLEOCIN) capsule 300 mg (300 mg Oral Given 01/27/15 2131)    23 y.o.Jenny Reed's evaluation in the Emergency Department is complete. It has been determined that no acute conditions requiring further emergency intervention are present at this time. The patient/guardian have been advised of the diagnosis and plan. We have discussed signs and symptoms that warrant return to the ED, such as changes or worsening in  symptoms.  Vital signs are stable at discharge. Filed Vitals:   01/27/15 2120  BP: 130/84  Pulse: 105  Temp: 98.1 F (36.7 C)  Resp: 18    Patient/guardian has voiced understanding and agreed to follow-up with the PCP or specialist.  I personally performed the services described in this documentation, which was scribed in my presence. The recorded information has been reviewed and is accurate.   Jenny Peliffany Seon Gaertner, PA-C 01/27/15 2252  Jenny BaleElliott Wentz, MD 01/28/15 0000

## 2015-01-27 NOTE — Discharge Instructions (Signed)
Abscess An abscess is an infected area that contains a collection of pus and debris.It can occur in almost any part of the body. An abscess is also known as a furuncle or boil. CAUSES  An abscess occurs when tissue gets infected. This can occur from blockage of oil or sweat glands, infection of hair follicles, or a minor injury to the skin. As the body tries to fight the infection, pus collects in the area and creates pressure under the skin. This pressure causes pain. People with weakened immune systems have difficulty fighting infections and get certain abscesses more often.  SYMPTOMS Usually an abscess develops on the skin and becomes a painful mass that is red, warm, and tender. If the abscess forms under the skin, you may feel a moveable soft area under the skin. Some abscesses break open (rupture) on their own, but most will continue to get worse without care. The infection can spread deeper into the body and eventually into the bloodstream, causing you to feel ill.  DIAGNOSIS  Your caregiver will take your medical history and perform a physical exam. A sample of fluid may also be taken from the abscess to determine what is causing your infection. TREATMENT  Your caregiver may prescribe antibiotic medicines to fight the infection. However, taking antibiotics alone usually does not cure an abscess. Your caregiver may need to make a small cut (incision) in the abscess to drain the pus. In some cases, gauze is packed into the abscess to reduce pain and to continue draining the area. HOME CARE INSTRUCTIONS   Only take over-the-counter or prescription medicines for pain, discomfort, or fever as directed by your caregiver.  If you were prescribed antibiotics, take them as directed. Finish them even if you start to feel better.  If gauze is used, follow your caregiver's directions for changing the gauze.  To avoid spreading the infection:  Keep your draining abscess covered with a  bandage.  Wash your hands well.  Do not share personal care items, towels, or whirlpools with others.  Avoid skin contact with others.  Keep your skin and clothes clean around the abscess.  Keep all follow-up appointments as directed by your caregiver. SEEK MEDICAL CARE IF:   You have increased pain, swelling, redness, fluid drainage, or bleeding.  You have muscle aches, chills, or a general ill feeling.  You have a fever. MAKE SURE YOU:   Understand these instructions.  Will watch your condition.  Will get help right away if you are not doing well or get worse. Document Released: 04/20/2005 Document Revised: 01/10/2012 Document Reviewed: 09/23/2011 [facility] Patient Information 2015 [place], [place]. This information is not intended to replace advice given to you by your health care provider. Make sure you discuss any questions you have with your health care provider.   RESOURCE GUIDE  Chronic Pain Problems: Contact [facility]  [phone] Patients need to be referred by their primary care doctor.  Insufficient Money for Medicine: Contact [employer]:  call "211" or [place] [phone].  No Primary Care Doctor: Call Health Connect  [phone] - can help you locate a primary care doctor that  accepts your insurance, provides certain services, etc. Physician Referral Service- [phone]  Agencies that provide inexpensive medical care: [facility]  [facility]  [phone] [facility]  [phone] [facility]  [phone] Planned Parenthood  [phone] [facility]  [phone]  [place] Providers: [facility]  [facility]- [address], [address]  [phone], Mon-Fri 9am-7pm, Sat 9am-1pm [place], [address] [place], [address] [place]  [place]- [address]  [place], [address], [phone]  Only accepts [place] patients after they have their name  applied to their card  Self Pay (no insurance) in [facility]: Sickle Cell Patients: Dr Kevan Ny, [facility]  [place], [facility]- [place]  [facility] [place]- [address], [facility]- see information above (Speak to [employer] if you do not have insurance)       -  [facility]- [place], [place],  [place], [place]  Dr Vista Lawman-  [address], [place], [place] [facility]- [address] [phone]       -  [facility]- [address], [place], also [address] [phone]       -    [facility]- [address], [place], 1st & 3rd Saturday   every month, 10am-1pm  1) Find a Doctor and Pay Out of Pocket Although you won't have to find out who is covered by your insurance plan, it is a good idea to ask around and get recommendations. You will then need to call the office and see if the doctor you have chosen will accept you as a new patient and what types of options they offer for patients who are self-pay. Some doctors offer discounts or will set up payment plans for their patients who do not have insurance, but you will need to ask so you aren't surprised when you get to your appointment.  2) Contact Your Local Health Department Not all health departments have doctors that can see patients for sick visits, but many do, so it is worth a call to see if yours does. If you don't know where your local health department is, you can check in your phone book. The CDC also has  a tool to help you locate your state's health department, and many state websites also have listings of all of their local health departments.  3) Find a [facility] If your illness is not likely to be very severe or complicated, you may want to try a walk in clinic. These are popping up all over the country in pharmacies, drugstores, and shopping centers. They're usually staffed by nurse practitioners or physician assistants that have been trained to treat common illnesses and complaints. They're usually fairly quick and inexpensive. However, if you have serious medical issues or chronic medical problems, these are probably not your best option  [facility] [place], [facility], [address], [place], phone [phone] or [phone].  Monday - Friday, call for an appointment. [place], [facility] Clinic, [address], [place], phone [phone] or [phone].  Monday - Friday, call for an appointment.  Abuse/Neglect: [place] [phone] [place] [phone] (After Hours)  Emergency Shelter:  [facility] [phone]  Maternity Homes: Room at the [place] [phone] [place] [phone]  MRSA Hotline #:   (787)463-5136  [facility] [place] Dept. [address]         [place] Phone:  [phone]                                  Phone:  [phone]                   Phone:  [phone]  [facility], [place]- [phone]       -     [facility] in [place], [address],                                  [place] [phone] or [phone] (After Hours)   [place]  Substance Abuse Resources: Alcohol and Drug Services  [phone] [place] [phone] [facility] [place] Chinita Pester [phone] Residential & Outpatient Substance Abuse Program  [phone]  Psychological Services: [place]  [phone] [place]  [place], [place]. [address] [place], [place]: [phone] or [phone], [email or web address]  Dental Assistance  If unable to pay or uninsured, contact:  [facility] or [facility]. to become qualified for the adult dental clinic.  Patients with Medicaid: [facility] [phone] [address], [place] [address], [phone]  If unable to pay, or uninsured, contact [facility] [phone]) or [place] [phone] in [place], [place] in [facility]) to become qualified for the adult dental clinic  Other [place]- [place], [place], [place], [address], [place], [place], 2nd and 4th Thursday of the month at 6:30am.  10 clients each day by appointment, can sometimes see walk-in patients if someone does not show for an appointment. [facility]- [address] [place]ard Danker [place], [place], [address], [place], [place], [place], [address], [place] [facility] [phone]  Please make every effort to establish with a primary care physician for routine medical care  [place] [address]  The [place] provides a wide  range of adult  health services. Some of these services are designed to address the healthcare needs of all [facility] residents and all services are designed to meet the needs of uninsured/underinsured low income residents. Some services are available to any resident of [place], call [phone] for details. ] The [facility], a new medical clinic for adults, is now open. For more information about the Center and its services please call [phone]. For information on our [place] services, click here.  For more information on any of the following Department of Public Health programs, including hours of service, click on the highlighted link.  SERVICES FOR WOMEN (Adults and Teens) [employer] provide a full range of birth control options plus education and counseling. New patient visit and annual return visits include a complete examination, pap test as indicated, and other laboratory as indicated. Included is our [employer] for men.  Maternity Care is provided through pregnancy, including a six week post partum exam. Women who meet eligibility criteria for the Medicaid for Pregnant Women program, receive care free. Other women are charged on a sliding scale according to income. Note: [facility] provides services to pregnant women who have a Medicaid card. Call [phone] for an appointment in [place] or [phone] for an appointment in [facility].  Primary Care for Medicaid [place] Access Women is available through the [place]. As primary care provider for the [place] program, women may designate the [facility] clinic as their primary care provider.  PLEASE CALL [phone] FOR AN APPOINTMENT FOR THE ABOVE SERVICES IN EITHER [place] OR [place]. Information available in [place] and [place].   Childbirth Education Classes are open  to the public and offered to help families prepare for the best possible childbirth experience as well as to promote lifelong health and wellness. Classes are offered throughout the year and meet on the same night once a week for five weeks. Medicaid covers the cost of the classes for the mother-to-be and her partner. For participants without Medicaid, the cost of the class series is $45.00 for the mother-to-be and her partner. Class size is limited and registration is required. For more information or to register call [phone]. Baby items donated by [employer] and the [employer] [place] are given away during each class series.  SERVICES FOR WOMEN AND MEN Sexually Transmitted Infection appointments, including HIV testing, are available daily (weekdays, except holidays). Call early as same-day appointments are limited. For an appointment in either [facility] or [place], call [phone]. Services are confidential and free of charge.  Skin Testing for Tuberculosis Please call [phone]. Adult Immunizations are available, usually for a fee. Please call [phone] for details.  PLEASE CALL [phone] FOR AN APPOINTMENT FOR THE ABOVE SERVICES IN EITHER [place] OR [place].   [facility] provides up to the minute recommended vaccines for your travel destination. We also provide essential health and political information to help insure a safe and pleasurable travel experience. This program is self-sustaining, however, fees are very competitive. We are a CERTIFIED YELLOW FEVER IMMUNIZATION approved clinic site. PLEASE CALL [phone] FOR AN APPOINTMENT IN EITHER [place] OR [place].   If you have questions about the services listed above, we want to answer them! Email [place] at: [email or web address] Home Visiting Services for elderly and the disabled are available to residents of [facility] who are in need of care that compares to the care offered  by a nursing home, have  needs that can be met by the program, and have [place] Medicaid. Other short term services are available to residents 18 years and older who are unable to meet requirements for eligibility to receive services from a certified home health agency, spend the majority of time at home, and need care for six months or less.  PLEASE CALL [id number] OR [phone] FOR MORE INFORMATION. Medication Assistance Program serves as a link between pharmaceutical companies and patients to provide low cost or free prescription medications. This servce is available for residents who meet certain income restrictions and have no insurance coverage.  PLEASE CALL [phone] ([place]) OR [phone] ([place]) FOR MORE INFORMATION.  Updated Feb. 21, 2013

## 2015-04-26 ENCOUNTER — Encounter (HOSPITAL_COMMUNITY): Payer: Self-pay | Admitting: Emergency Medicine

## 2015-04-26 ENCOUNTER — Emergency Department (HOSPITAL_COMMUNITY)
Admission: EM | Admit: 2015-04-26 | Discharge: 2015-04-26 | Disposition: A | Discharge status: Home / Self Care | Payer: Medicaid Other | Attending: Emergency Medicine | Admitting: Emergency Medicine

## 2015-04-26 ENCOUNTER — Emergency Department (HOSPITAL_COMMUNITY): Payer: Medicaid Other

## 2015-04-26 DIAGNOSIS — Z87448 Personal history of other diseases of urinary system: Secondary | ICD-10-CM | POA: Insufficient documentation

## 2015-04-26 DIAGNOSIS — Z8742 Personal history of other diseases of the female genital tract: Secondary | ICD-10-CM | POA: Insufficient documentation

## 2015-04-26 DIAGNOSIS — Z3202 Encounter for pregnancy test, result negative: Secondary | ICD-10-CM | POA: Insufficient documentation

## 2015-04-26 DIAGNOSIS — F1110 Opioid abuse, uncomplicated: Secondary | ICD-10-CM | POA: Insufficient documentation

## 2015-04-26 DIAGNOSIS — Z87442 Personal history of urinary calculi: Secondary | ICD-10-CM | POA: Insufficient documentation

## 2015-04-26 DIAGNOSIS — Z720 Tobacco use: Secondary | ICD-10-CM | POA: Insufficient documentation

## 2015-04-26 DIAGNOSIS — R51 Headache: Secondary | ICD-10-CM | POA: Insufficient documentation

## 2015-04-26 DIAGNOSIS — R569 Unspecified convulsions: Secondary | ICD-10-CM | POA: Insufficient documentation

## 2015-04-26 DIAGNOSIS — R350 Frequency of micturition: Secondary | ICD-10-CM | POA: Insufficient documentation

## 2015-04-26 HISTORY — DX: Other psychoactive substance abuse, uncomplicated: F19.10

## 2015-04-26 LAB — CBC WITH DIFFERENTIAL/PLATELET
Basophils Absolute: 0 10*3/uL (ref 0.0–0.1)
Basophils Relative: 0 %
EOS PCT: 1 %
Eosinophils Absolute: 0.1 10*3/uL (ref 0.0–0.7)
HCT: 36.1 % (ref 36.0–46.0)
Hemoglobin: 11.5 g/dL — ABNORMAL LOW (ref 12.0–15.0)
LYMPHS ABS: 1.7 10*3/uL (ref 0.7–4.0)
Lymphocytes Relative: 22 %
MCH: 27.3 pg (ref 26.0–34.0)
MCHC: 31.9 g/dL (ref 30.0–36.0)
MCV: 85.5 fL (ref 78.0–100.0)
Monocytes Absolute: 0.4 10*3/uL (ref 0.1–1.0)
Monocytes Relative: 5 %
Neutro Abs: 5.6 10*3/uL (ref 1.7–7.7)
Neutrophils Relative %: 72 %
PLATELETS: 466 10*3/uL — AB (ref 150–400)
RBC: 4.22 MIL/uL (ref 3.87–5.11)
RDW: 13.9 % (ref 11.5–15.5)
WBC: 7.8 10*3/uL (ref 4.0–10.5)

## 2015-04-26 LAB — BASIC METABOLIC PANEL
Anion gap: 7 (ref 5–15)
BUN: 9 mg/dL (ref 6–20)
CO2: 25 mmol/L (ref 22–32)
Calcium: 9.3 mg/dL (ref 8.9–10.3)
Chloride: 106 mmol/L (ref 101–111)
Creatinine, Ser: 0.65 mg/dL (ref 0.44–1.00)
GFR calc Af Amer: >60 mL/min (ref >60)
Glucose, Bld: 106 mg/dL — ABNORMAL HIGH (ref 65–99)
Potassium: 4.1 mmol/L (ref 3.5–5.1)
Sodium: 138 mmol/L (ref 135–145)

## 2015-04-26 LAB — URINALYSIS, ROUTINE W REFLEX MICROSCOPIC
Bilirubin Urine: NEGATIVE
Glucose, UA: NEGATIVE mg/dL
Hgb urine dipstick: NEGATIVE
Ketones, ur: NEGATIVE mg/dL
Leukocytes, UA: NEGATIVE
Nitrite: NEGATIVE
PROTEIN: NEGATIVE mg/dL
Specific Gravity, Urine: 1.016 (ref 1.005–1.030)
Urobilinogen, UA: 1 mg/dL (ref 0.0–1.0)
pH: 7 (ref 5.0–8.0)

## 2015-04-26 LAB — POC URINE PREG, ED: Preg Test, Ur: NEGATIVE
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
## 2015-04-26 NOTE — ED Notes (Signed)
Patient undressed, in gown, on monitor, continuous pulse oximetry and blood pressure cuff; visitor at bedside 

## 2015-04-26 NOTE — ED Provider Notes (Signed)
CSN: [id number]     Arrival date & time 04/26/15  1302 History   First MD Initiated Contact with Patient 04/26/15 1306     Chief Complaint  Patient presents with  . Seizures     (Consider location/radiation/quality/duration/timing/severity/associated sxs/prior Treatment) HPI Comments: Patient with history of heavy heroin use, occasional benzos but nothing recently, no alcohol -- presents with c/o seizure. Seizure was witnessed by the patient's boyfriend. Patient fell to the ground several minutes after shooting heroin. Patient was described as having generalized shaking. Boyfriend called EMS. Patient was postictal, did her tongue. Currently she combines of headache behind her eyes. She does not remember the events around the time that she fell. Patient also complains of several days of urinary frequency. She likely had her head when she fell. No weakness, numbness, or tingling in arms or legs. No vision change or vomiting. Patient thinks that she had a seizure at one point in the past, also related to drug use.  The history is provided by the patient.    Past Medical History  Diagnosis Date  . H/O bladder infections   . Mastitis   . Renal disorder   . Kidney stone   . IV drug abuse    Past Surgical History  Procedure Laterality Date  . Cesarean section    . Tooth extraction     No family history on file. Social History  Substance Use Topics  . Smoking status: Current Every Day Smoker    Types: Cigarettes  . Smokeless tobacco: None  . Alcohol Use: Yes     Comment: occ   OB History    Gravida Para Term Preterm AB TAB SAB Ectopic Multiple Living   Review of Systems  Constitutional: Negative for fever.  HENT: Negative for congestion, dental problem, rhinorrhea and sinus pressure.   Eyes: Negative for photophobia, discharge, redness and visual disturbance.  Respiratory: Negative for shortness of breath.   Cardiovascular: Negative for chest pain.   Gastrointestinal: Negative for nausea and vomiting.  Genitourinary: Positive for frequency.  Musculoskeletal: Negative for gait problem, neck pain and neck stiffness.  Skin: Negative for rash.  Neurological: Positive for seizures and headaches. Negative for syncope, speech difficulty, weakness, light-headedness and numbness.  Psychiatric/Behavioral: Negative for confusion.      Allergies  Aspirin and Bactrim  Home Medications   Prior to Admission medications   Medication Sig Start Date End Date Taking? Authorizing Provider  acetaminophen (TYLENOL) 500 MG tablet Take 1,000 mg by mouth every 6 (six) hours as needed for moderate pain.    Historical Provider, MD  clindamycin (CLEOCIN) 300 MG capsule Take 1 capsule (300 mg total) by mouth every 8 (eight) hours. 01/27/15   Tiffany Neva Seat, PA-C   BP 119/80 mmHg  Pulse 99  Temp(Src) 98.2 F (36.8 C) (Oral)  Resp 18  SpO2 97%  LMP  (LMP Unknown)   Physical Exam  Constitutional: She is oriented to person, place, and time. She appears well-developed and well-nourished.  HENT:  Head: Normocephalic and atraumatic. Head is without raccoon's eyes and without Battle's sign.  Right Ear: Tympanic membrane, external ear and ear canal normal. No hemotympanum.  Left Ear: Tympanic membrane, external ear and ear canal normal. No hemotympanum.  Nose: Nose normal. No nasal septal hematoma.  Mouth/Throat: Uvula is midline, oropharynx is clear and moist and mucous membranes are normal.  Eyes: Conjunctivae, EOM and lids are normal. Pupils  are equal, round, and reactive to light. Right eye exhibits no nystagmus. Left eye exhibits no nystagmus.  No visible hyphema noted  Neck: Normal range of motion. Neck supple.  Cardiovascular: Normal rate and regular rhythm.   Pulmonary/Chest: Effort normal and breath sounds normal.  Abdominal: Soft. There is no tenderness.  Musculoskeletal:       Cervical back: She exhibits normal range of motion, no tenderness and  no bony tenderness.       Thoracic back: She exhibits no tenderness and no bony tenderness.       Lumbar back: She exhibits no tenderness and no bony tenderness.  Neurological: She is alert and oriented to person, place, and time. She has normal strength and normal reflexes. No cranial nerve deficit or sensory deficit. Coordination normal. GCS eye subscore is 4. GCS verbal subscore is 5. GCS motor subscore is 6.  Skin: Skin is warm and dry.  Multiple puncture sites to upper extremities, none of which appear overtly infected.  Psychiatric: She has a normal mood and affect.  Nursing note and vitals reviewed.   ED Course  Procedures (including critical care time) Labs Review Labs Reviewed  CBC WITH DIFFERENTIAL/PLATELET - Abnormal; Notable for the following:    Hemoglobin 11.5 (*)    Platelets 466 (*)    All other components within normal limits  BASIC METABOLIC PANEL - Abnormal; Notable for the following:    Glucose, Bld 106 (*)    All other components within normal limits  URINALYSIS, ROUTINE W REFLEX MICROSCOPIC (NOT AT [facility])  POC URINE PREG, ED    Imaging Review Ct Head Wo Contrast  04/26/2015   CLINICAL DATA:  Seizure.  Fall.  Headache.  Heroin use.  EXAM: CT HEAD WITHOUT CONTRAST  TECHNIQUE: Contiguous axial images were obtained from the base of the skull through the vertex without intravenous contrast.  COMPARISON:  04/22/2013 head CT.  FINDINGS: No evidence of parenchymal hemorrhage or extra-axial fluid collection. No mass lesion, mass effect, or midline shift.  No CT evidence of acute infarction.  Cerebral volume is age appropriate.  No ventriculomegaly.  Near complete opacification of the ethmoidal air cells. Mucosal thickening and patchy opacification of the visualized sphenoid sinus and upper maxillary sinuses. Fluid levels are seen in the visualized upper left maxillary sinus and sphenoid sinus. Mastoid air cells are clear. No evidence of calvarial fracture.  IMPRESSION: 1. No  acute intracranial abnormality.  No calvarial fracture. 2. Findings suggestive of acute sinusitis in the paranasal sinuses.   Electronically Signed   By: Delbert Phenix M.D.   On: 04/26/2015 14:38   I have personally reviewed and evaluated these images and lab results as part of my medical decision-making.   EKG Interpretation   Date/Time:  Sunday April 26 2015 13:03:52 EDT Ventricular Rate:  100 PR Interval:  183 QRS Duration: 88 QT Interval:  352 QTC Calculation: 454 R Axis:   73 Text Interpretation:  Sinus tachycardia No previous tracing Confirmed by  Anitra Lauth  MD, Alphonzo Lemmings ([id number]) on 04/26/2015 1:31:33 PM       1:48 PM Patient seen and examined. Work-up initiated.   Vital signs reviewed and are as follows: BP 119/80 mmHg  Pulse 99  Temp(Src) 98.2 F (36.8 C) (Oral)  Resp 18  SpO2 97%  LMP  (LMP Unknown)  Patient updated after testing completed.   4:36 PM Patient apparently was assisted to the bathroom prior to discharge and then eloped without informing staff. She did not receive d/c  instructions.   MDM   Final diagnoses:  Seizure (HCC)  Heroin abuse   Pt with h/o heavy heroin abuse, with apparent seizure, fall after using today. Head CT neg for injury. EKG, urine preg, UA, CBC/BMP neg. No skin infection noted. Pt eloped prior to final discharge.     Renne Crigler, PA-C 04/27/15 1610  Gwyneth Sprout, MD 04/27/15 1620

## 2015-04-26 NOTE — ED Notes (Signed)
Assisted patient to the bathroom; she did not return to room and is no longer in the bathroom. Notified PA of patient elopement. Ok. She did not have an IV.

## 2015-04-26 NOTE — ED Notes (Signed)
patient has returned from being out of the department; patient up ambulatory to the bathroom at this time to attempt to provide an urine sample; visitor in room

## 2015-06-13 ENCOUNTER — Emergency Department (HOSPITAL_COMMUNITY): Payer: Medicaid Other

## 2015-06-13 ENCOUNTER — Emergency Department (HOSPITAL_COMMUNITY)
Admission: EM | Admit: 2015-06-13 | Discharge: 2015-06-13 | Disposition: A | Discharge status: Home / Self Care | Payer: Medicaid Other | Attending: Emergency Medicine | Admitting: Emergency Medicine

## 2015-06-13 DIAGNOSIS — S4992XA Unspecified injury of left shoulder and upper arm, initial encounter: Secondary | ICD-10-CM | POA: Insufficient documentation

## 2015-06-13 DIAGNOSIS — R000 Tachycardia, unspecified: Secondary | ICD-10-CM | POA: Insufficient documentation

## 2015-06-13 DIAGNOSIS — S70211A Abrasion, right hip, initial encounter: Secondary | ICD-10-CM | POA: Insufficient documentation

## 2015-06-13 DIAGNOSIS — S80212A Abrasion, left knee, initial encounter: Secondary | ICD-10-CM | POA: Insufficient documentation

## 2015-06-13 DIAGNOSIS — S80211A Abrasion, right knee, initial encounter: Secondary | ICD-10-CM | POA: Insufficient documentation

## 2015-06-13 DIAGNOSIS — S70212A Abrasion, left hip, initial encounter: Secondary | ICD-10-CM | POA: Insufficient documentation

## 2015-06-13 DIAGNOSIS — Z87442 Personal history of urinary calculi: Secondary | ICD-10-CM | POA: Insufficient documentation

## 2015-06-13 DIAGNOSIS — Z3202 Encounter for pregnancy test, result negative: Secondary | ICD-10-CM | POA: Insufficient documentation

## 2015-06-13 DIAGNOSIS — S0081XA Abrasion of other part of head, initial encounter: Secondary | ICD-10-CM

## 2015-06-13 DIAGNOSIS — Y9389 Activity, other specified: Secondary | ICD-10-CM | POA: Insufficient documentation

## 2015-06-13 DIAGNOSIS — Z8742 Personal history of other diseases of the female genital tract: Secondary | ICD-10-CM | POA: Insufficient documentation

## 2015-06-13 DIAGNOSIS — Y998 Other external cause status: Secondary | ICD-10-CM | POA: Insufficient documentation

## 2015-06-13 DIAGNOSIS — F17210 Nicotine dependence, cigarettes, uncomplicated: Secondary | ICD-10-CM | POA: Insufficient documentation

## 2015-06-13 DIAGNOSIS — Y92410 Unspecified street and highway as the place of occurrence of the external cause: Secondary | ICD-10-CM | POA: Insufficient documentation

## 2015-06-13 DIAGNOSIS — T1490XA Injury, unspecified, initial encounter: Secondary | ICD-10-CM

## 2015-06-13 LAB — COMPREHENSIVE METABOLIC PANEL
ALT: 29 U/L (ref 14–54)
AST: 56 U/L — AB (ref 15–41)
Albumin: 3.4 g/dL — ABNORMAL LOW (ref 3.5–5.0)
Alkaline Phosphatase: 70 U/L (ref 38–126)
Anion gap: 11 (ref 5–15)
BILIRUBIN TOTAL: 0.2 mg/dL — AB (ref 0.3–1.2)
BUN: 11 mg/dL (ref 6–20)
CHLORIDE: 106 mmol/L (ref 101–111)
CO2: 24 mmol/L (ref 22–32)
Calcium: 9.3 mg/dL (ref 8.9–10.3)
Creatinine, Ser: 0.84 mg/dL (ref 0.44–1.00)
GFR calc Af Amer: >60 mL/min (ref >60)
GFR calc non Af Amer: >60 mL/min (ref >60)
GLUCOSE: 103 mg/dL — AB (ref 65–99)
POTASSIUM: 3.4 mmol/L — AB (ref 3.5–5.1)
SODIUM: 141 mmol/L (ref 135–145)
TOTAL PROTEIN: 6.6 g/dL (ref 6.5–8.1)

## 2015-06-13 LAB — CBC WITH DIFFERENTIAL/PLATELET
BASOS ABS: 0 10*3/uL (ref 0.0–0.1)
BASOS PCT: 0 %
EOS ABS: 0.2 10*3/uL (ref 0.0–0.7)
Eosinophils Relative: 2 %
HCT: 38.3 % (ref 36.0–46.0)
HEMOGLOBIN: 12 g/dL (ref 12.0–15.0)
LYMPHS ABS: 3.3 10*3/uL (ref 0.7–4.0)
Lymphocytes Relative: 30 %
MCH: 27.3 pg (ref 26.0–34.0)
MCHC: 31.3 g/dL (ref 30.0–36.0)
MCV: 87.2 fL (ref 78.0–100.0)
Monocytes Absolute: 0.6 10*3/uL (ref 0.1–1.0)
Monocytes Relative: 5 %
NEUTROS PCT: 63 %
Neutro Abs: 7 10*3/uL (ref 1.7–7.7)
Platelets: 444 10*3/uL — ABNORMAL HIGH (ref 150–400)
RBC: 4.39 MIL/uL (ref 3.87–5.11)
RDW: 15.1 % (ref 11.5–15.5)
WBC: 11.1 10*3/uL — ABNORMAL HIGH (ref 4.0–10.5)

## 2015-06-13 LAB — LIPASE, BLOOD: Lipase: 29 U/L (ref 11–51)

## 2015-06-13 LAB — I-STAT BETA HCG BLOOD, ED (MC, WL, AP ONLY): I-stat hCG, quantitative: <5 m[IU]/mL (ref <5)

## 2015-06-13 MED ORDER — FENTANYL CITRATE (PF) 100 MCG/2ML IJ SOLN
50.0000 ug | Freq: Once | INTRAMUSCULAR | Status: AC
  Administered 2015-06-13: 50 ug via INTRAVENOUS

## 2015-06-13 MED ORDER — FENTANYL CITRATE (PF) 100 MCG/2ML IJ SOLN
INTRAMUSCULAR | Status: AC
  Administered 2015-06-13: 50 ug via INTRAVENOUS
  Filled 2015-06-13: qty 2

## 2015-06-13 MED ORDER — SODIUM CHLORIDE 0.9 % IV BOLUS (SEPSIS)
1000.0000 mL | Freq: Once | INTRAVENOUS | Status: AC
  Administered 2015-06-13: 1000 mL via INTRAVENOUS

## 2015-06-13 NOTE — ED Notes (Signed)
Pt wakes and helps this RN dress her. Spoke with pt and father about father coming to pick pt up. Pt agrees and wants to go home with him

## 2015-06-13 NOTE — ED Notes (Signed)
Transported to radiology 
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
## 2015-06-13 NOTE — ED Notes (Signed)
Pt's father here to take pt home.

## 2015-06-13 NOTE — ED Notes (Signed)
[employer] in attempting to speak with pt. Pt sleeping. resp e/u. Skin w/d. Will wake and mumble but goes immediately back to sleep. All pulses palpable
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
## 2015-07-27 ENCOUNTER — Emergency Department (HOSPITAL_COMMUNITY)
Admission: EM | Admit: 2015-07-27 | Discharge: 2015-07-27 | Disposition: A | Discharge status: Home / Self Care | Payer: No Typology Code available for payment source

## 2015-07-27 NOTE — ED Notes (Signed)
Have called pt multiple times, not in lobby

## 2017-09-15 IMAGING — CT CT HEAD W/O CM
2 series · 16 of 30 positions shown, 18 images · non-contrast
Comparison: 04/22/2013 head CT.

CLINICAL DATA: Seizure.  Fall.  Headache.  Heroin use.

EXAM:
CT HEAD WITHOUT CONTRAST
TECHNIQUE: Contiguous axial images were obtained from the base of the skull
through the vertex without intravenous contrast.

[Series 201: head w/o, idose (1) · axial · non-contrast · 0.40mm/px · z∈[+94,+199]mm · 8 of 29 slices shown, 10 images]
[im 4/29  brain]
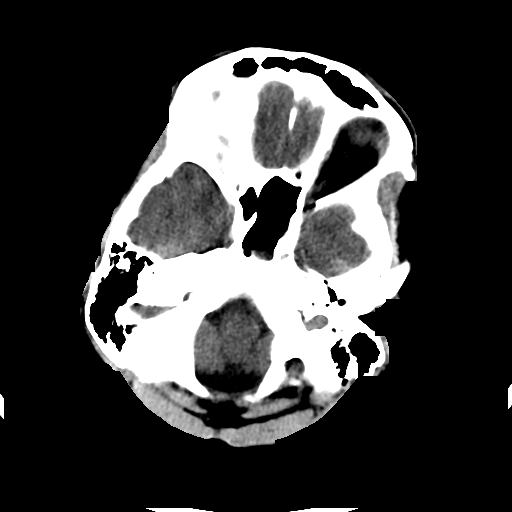
[im 4/29  bone]
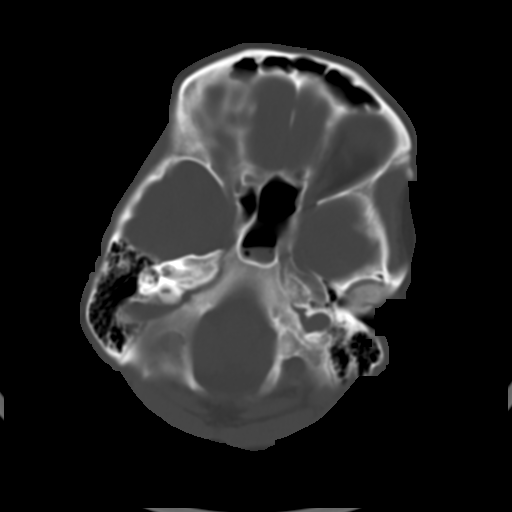
[im 7/29  brain]
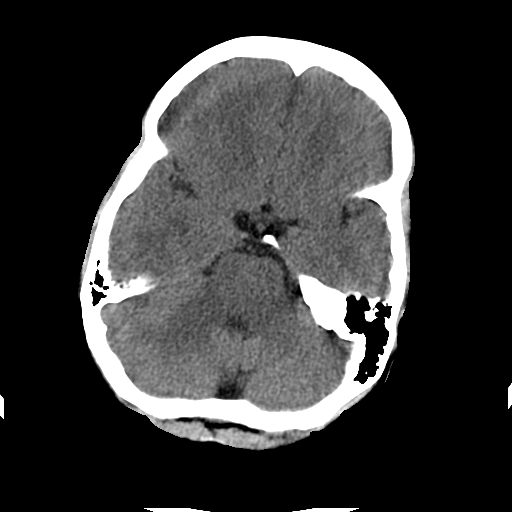
[im 10/29  brain]
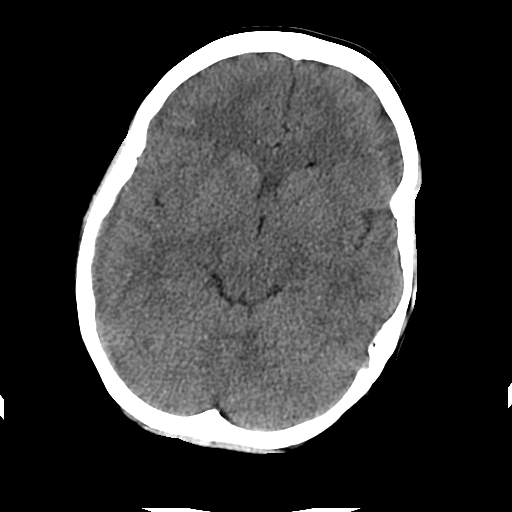
[im 13/29  brain]
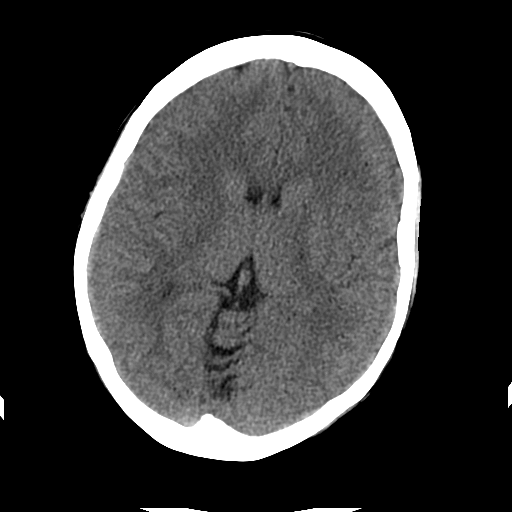
[im 16/29  brain]
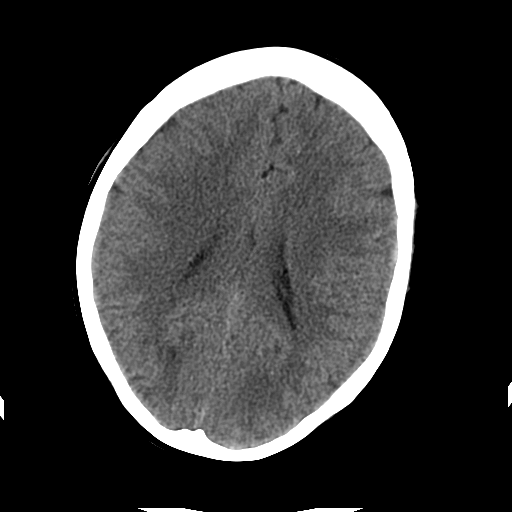
[im 16/29  bone]
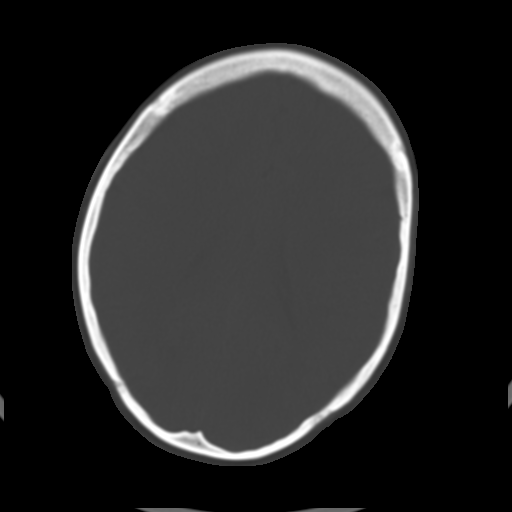
[im 19/29  brain]
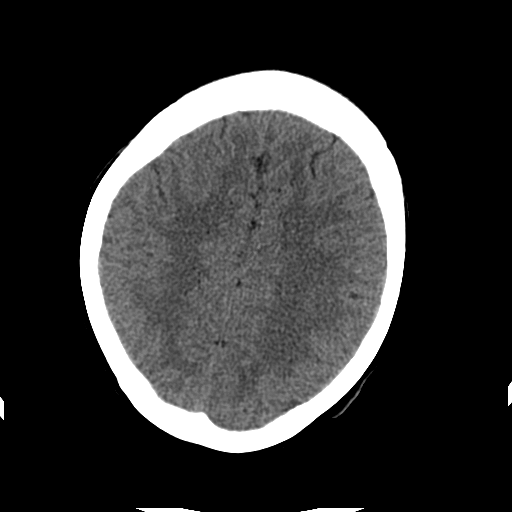
[im 22/29  brain]
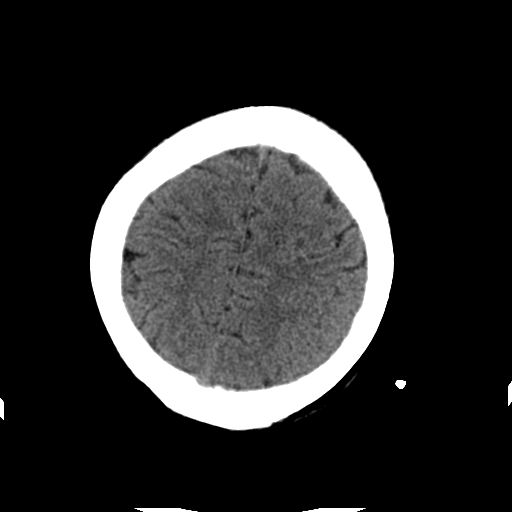
[im 25/29  brain]
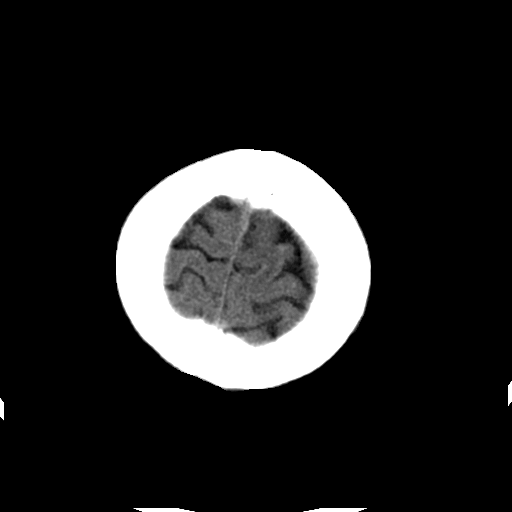

[Series 202: head w/o bone, idose (1) · axial · non-contrast · 0.40mm/px · z∈[+93,+205]mm · 8 of 58 slices shown]
[im 7/58  bone]
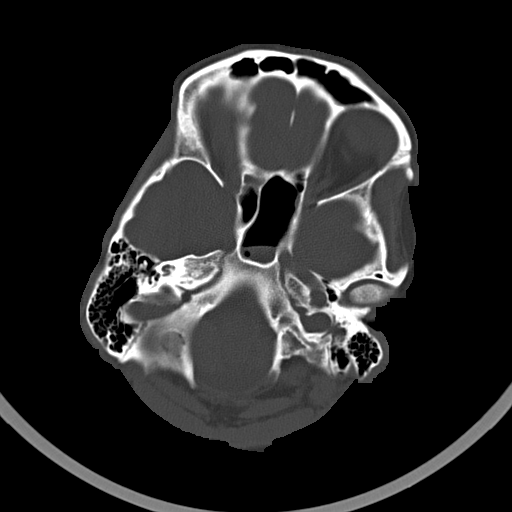
[im 13/58  bone]
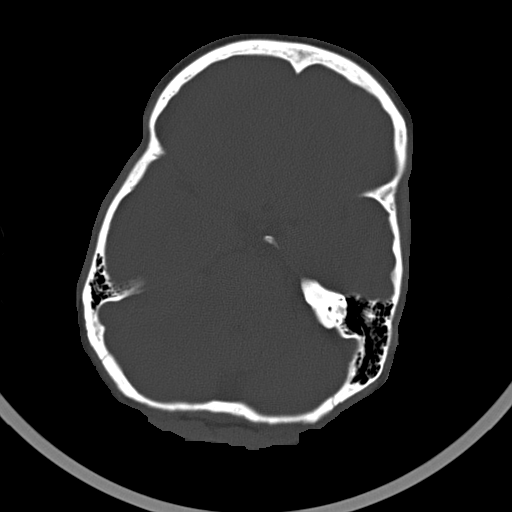
[im 19/58  bone]
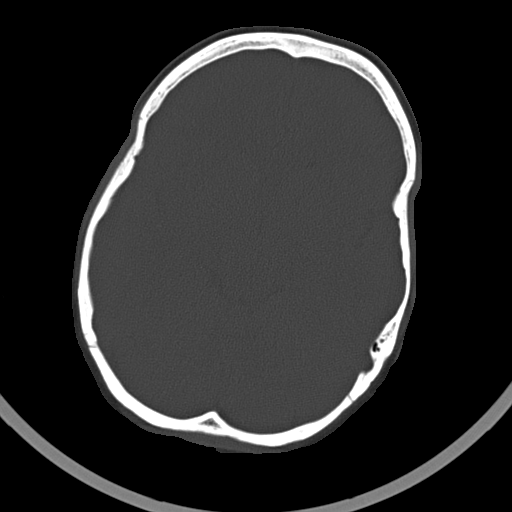
[im 25/58  bone]
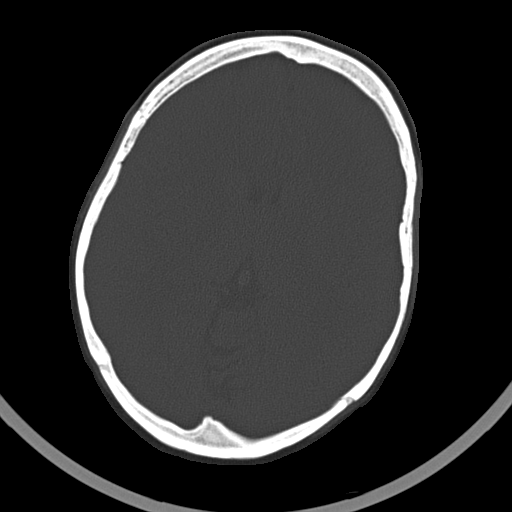
[im 34/58  bone]
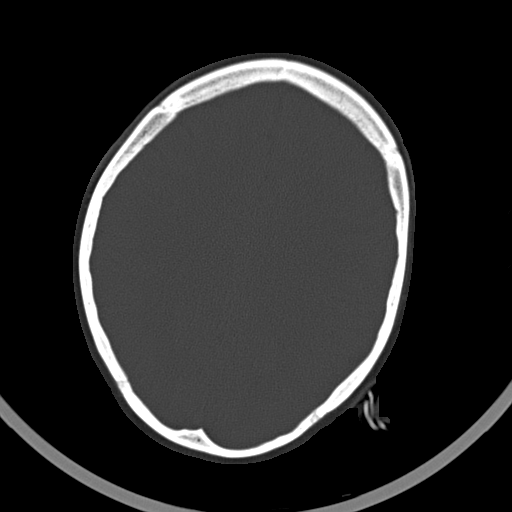
[im 40/58  bone]
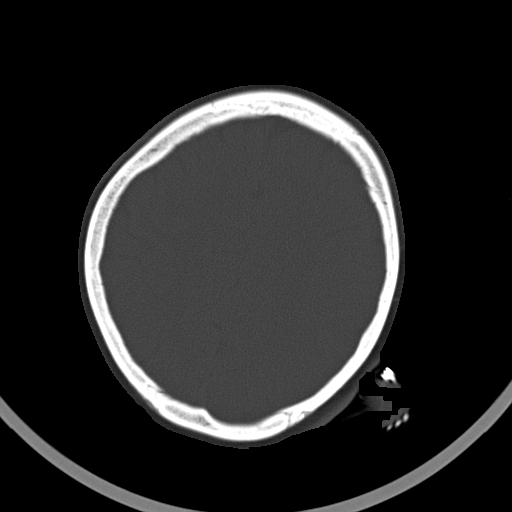
[im 46/58  bone]
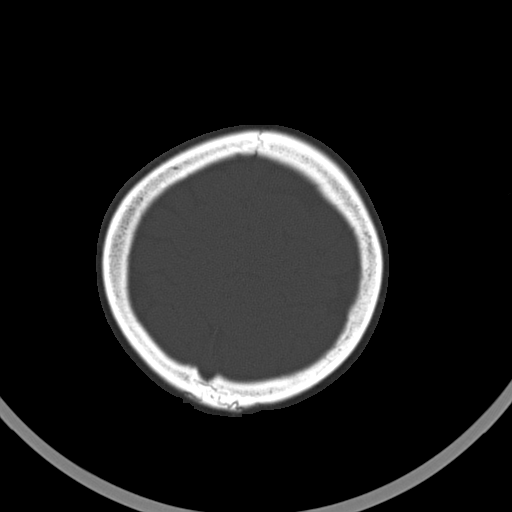
[im 52/58  bone]
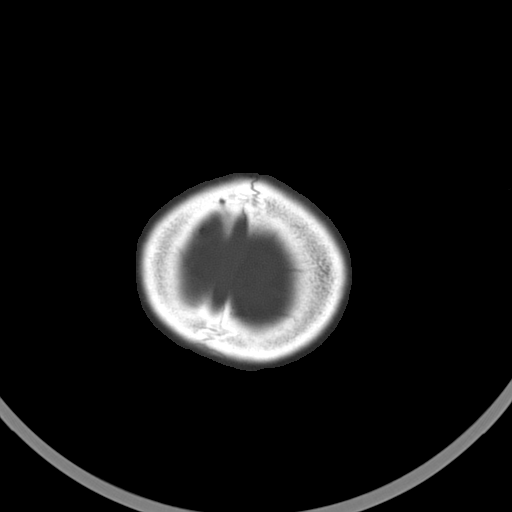

[16 of 30 positions shown; findings below may reference images not displayed]

FINDINGS: No evidence of parenchymal hemorrhage or extra-axial fluid
collection. No mass lesion, mass effect, or midline shift.

No CT evidence of acute infarction.

Cerebral volume is age appropriate.  No ventriculomegaly.

Near complete opacification of the ethmoidal air cells. Mucosal
thickening and patchy opacification of the visualized sphenoid sinus
and upper maxillary sinuses. Fluid levels are seen in the visualized
upper left maxillary sinus and sphenoid sinus. Mastoid air cells are
clear. No evidence of calvarial fracture.
IMPRESSION: 1. No acute intracranial abnormality.  No calvarial fracture.
2. Findings suggestive of acute sinusitis in the paranasal sinuses.

## 2018-12-31 ENCOUNTER — Encounter (HOSPITAL_COMMUNITY): Payer: Self-pay

## 2018-12-31 ENCOUNTER — Emergency Department (HOSPITAL_COMMUNITY)
Admission: EM | Admit: 2018-12-31 | Discharge: 2018-12-31 | Disposition: A | Discharge status: Home / Self Care | Payer: No Typology Code available for payment source | Attending: Pediatric Emergency Medicine | Admitting: Pediatric Emergency Medicine

## 2018-12-31 ENCOUNTER — Emergency Department (HOSPITAL_COMMUNITY): Payer: No Typology Code available for payment source

## 2018-12-31 DIAGNOSIS — Y92411 Interstate highway as the place of occurrence of the external cause: Secondary | ICD-10-CM | POA: Diagnosis not present

## 2018-12-31 DIAGNOSIS — S63501A Unspecified sprain of right wrist, initial encounter: Secondary | ICD-10-CM | POA: Insufficient documentation

## 2018-12-31 DIAGNOSIS — S6991XA Unspecified injury of right wrist, hand and finger(s), initial encounter: Secondary | ICD-10-CM | POA: Diagnosis present

## 2018-12-31 DIAGNOSIS — Y999 Unspecified external cause status: Secondary | ICD-10-CM | POA: Insufficient documentation

## 2018-12-31 DIAGNOSIS — F17210 Nicotine dependence, cigarettes, uncomplicated: Secondary | ICD-10-CM | POA: Diagnosis not present

## 2018-12-31 DIAGNOSIS — Y9389 Activity, other specified: Secondary | ICD-10-CM | POA: Insufficient documentation

## 2018-12-31 MED ORDER — IBUPROFEN 400 MG PO TABS
400.0000 mg | ORAL_TABLET | Freq: Once | ORAL | Status: AC
  Administered 2018-12-31: 400 mg via ORAL
  Filled 2018-12-31: qty 1
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
## 2018-12-31 NOTE — ED Notes (Addendum)
Entered on wrong patient

## 2018-12-31 NOTE — ED Notes (Signed)
ED Provider at bedside. 
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
## 2022-07-20 ENCOUNTER — Other Ambulatory Visit: Payer: Self-pay

## 2022-07-20 ENCOUNTER — Encounter (HOSPITAL_COMMUNITY): Payer: Self-pay

## 2022-07-20 ENCOUNTER — Emergency Department (HOSPITAL_COMMUNITY)

## 2022-07-20 ENCOUNTER — Emergency Department (EMERGENCY_DEPARTMENT_HOSPITAL)
Admission: EM | Admit: 2022-07-20 | Discharge: 2022-07-20 | Discharge status: Against Medical Advice | Source: Home / Self Care | Attending: Emergency Medicine | Admitting: Emergency Medicine

## 2022-07-20 DIAGNOSIS — F1410 Cocaine abuse, uncomplicated: Secondary | ICD-10-CM | POA: Insufficient documentation

## 2022-07-20 DIAGNOSIS — Z5329 Procedure and treatment not carried out because of patient's decision for other reasons: Secondary | ICD-10-CM | POA: Insufficient documentation

## 2022-07-20 DIAGNOSIS — F13239 Sedative, hypnotic or anxiolytic dependence with withdrawal, unspecified: Secondary | ICD-10-CM | POA: Insufficient documentation

## 2022-07-20 DIAGNOSIS — R569 Unspecified convulsions: Secondary | ICD-10-CM

## 2022-07-20 DIAGNOSIS — F1990 Other psychoactive substance use, unspecified, uncomplicated: Secondary | ICD-10-CM

## 2022-07-20 DIAGNOSIS — F1910 Other psychoactive substance abuse, uncomplicated: Secondary | ICD-10-CM | POA: Insufficient documentation

## 2022-07-20 DIAGNOSIS — F1123 Opioid dependence with withdrawal: Secondary | ICD-10-CM | POA: Insufficient documentation

## 2022-07-20 DIAGNOSIS — F17210 Nicotine dependence, cigarettes, uncomplicated: Secondary | ICD-10-CM | POA: Insufficient documentation

## 2022-07-20 LAB — CBC WITH DIFFERENTIAL/PLATELET
Abs Immature Granulocytes: 0.03 10*3/uL (ref 0.00–0.07)
Basophils Absolute: 0 10*3/uL (ref 0.0–0.1)
Basophils Relative: 0 %
Eosinophils Absolute: 0.2 10*3/uL (ref 0.0–0.5)
Eosinophils Relative: 2 %
HCT: 37.5 % (ref 36.0–46.0)
Hemoglobin: 11.1 g/dL — ABNORMAL LOW (ref 12.0–15.0)
Immature Granulocytes: 0 %
Lymphocytes Relative: 23 %
Lymphs Abs: 2 10*3/uL (ref 0.7–4.0)
MCH: 24.9 pg — ABNORMAL LOW (ref 26.0–34.0)
MCHC: 29.6 g/dL — ABNORMAL LOW (ref 30.0–36.0)
MCV: 84.1 fL (ref 80.0–100.0)
Monocytes Absolute: 0.5 10*3/uL (ref 0.1–1.0)
Monocytes Relative: 6 %
Neutro Abs: 6 10*3/uL (ref 1.7–7.7)
Neutrophils Relative %: 69 %
Platelets: 429 10*3/uL — ABNORMAL HIGH (ref 150–400)
RBC: 4.46 MIL/uL (ref 3.87–5.11)
RDW: 14.6 % (ref 11.5–15.5)
WBC: 8.7 10*3/uL (ref 4.0–10.5)
nRBC: 0 % (ref 0.0–0.2)

## 2022-07-20 LAB — CBG MONITORING, ED
Glucose-Capillary: 109 mg/dL — ABNORMAL HIGH (ref 70–99)
Glucose-Capillary: 106 mg/dL — ABNORMAL HIGH (ref 70–99)

## 2022-07-20 LAB — COMPREHENSIVE METABOLIC PANEL
ALT: 96 U/L — ABNORMAL HIGH (ref 0–44)
AST: 131 U/L — ABNORMAL HIGH (ref 15–41)
Albumin: 3.6 g/dL (ref 3.5–5.0)
Alkaline Phosphatase: 73 U/L (ref 38–126)
Anion gap: 6 (ref 5–15)
BUN: 10 mg/dL (ref 6–20)
CO2: 23 mmol/L (ref 22–32)
Calcium: 8.5 mg/dL — ABNORMAL LOW (ref 8.9–10.3)
Chloride: 109 mmol/L (ref 98–111)
Creatinine, Ser: 0.42 mg/dL — ABNORMAL LOW (ref 0.44–1.00)
GFR, Estimated: >60 mL/min (ref >60)
Glucose, Bld: 107 mg/dL — ABNORMAL HIGH (ref 70–99)
Potassium: 3.8 mmol/L (ref 3.5–5.1)
Sodium: 138 mmol/L (ref 135–145)
Total Bilirubin: 0.4 mg/dL (ref 0.3–1.2)
Total Protein: 6.9 g/dL (ref 6.5–8.1)

## 2022-07-20 LAB — I-STAT BETA HCG BLOOD, ED (MC, WL, AP ONLY): I-stat hCG, quantitative: <5 m[IU]/mL (ref <5)

## 2022-07-20 MED ORDER — LORAZEPAM 2 MG/ML IJ SOLN: 1.0000 mg | Freq: Once | INTRAMUSCULAR | Status: DC

## 2022-07-20 MED ORDER — LORAZEPAM 2 MG/ML IJ SOLN
2.0000 mg | Freq: Once | INTRAMUSCULAR | Status: AC
  Administered 2022-07-20: 2 mg via INTRAVENOUS
  Filled 2022-07-20: qty 1

## 2022-07-20 MED ORDER — ACETAMINOPHEN 500 MG PO TABS
1000.0000 mg | ORAL_TABLET | Freq: Once | ORAL | Status: AC
  Administered 2022-07-20: 1000 mg via ORAL
  Filled 2022-07-20: qty 2

## 2022-07-20 MED ORDER — LORAZEPAM 2 MG/ML IJ SOLN
1.0000 mg | Freq: Once | INTRAMUSCULAR | Status: AC
  Administered 2022-07-20: 1 mg via INTRAVENOUS
  Filled 2022-07-20: qty 1

## 2022-07-20 NOTE — Progress Notes (Signed)
Patient told EEG Tech she [place] refusing treatment. Wilford Corner and Amada Jupiter are aware of refusal.

## 2022-07-20 NOTE — ED Triage Notes (Signed)
[facility] reports seizure episode this evening , alert and oriented /respirations unlabored , no incontinence , seen at [facility] ER this after afternoon for seizure.

## 2022-07-20 NOTE — Discharge Instructions (Signed)
Thank you for coming to [facility] Emergency Department. You were seen for seizures. We did an exam, labs, and imaging, and these showed seizure activity, possibly associated with benzodiazepine withdrawal.  You are about to be evaluated with EEG monitoring and by neurology after being transferred to [facility] from [facility] but you decided to leave AGAINST MEDICAL ADVICE.  You were advised of the risks of leaving today including further seizure activity, head trauma/cervical spine injury, fractures, permanent brain damage, permanent disability, or death.  You reported understanding and capacity to make this decision and you will be leaving AGAINST MEDICAL ADVICE.  Please return to the emergency department at your earliest convenience.  Please follow up with your primary care provider within 1 week.   Do not hesitate to return to the ED or call 911 if you experience: -Worsening symptoms -Further seizure activity -Lightheadedness, passing out -Fevers/chills -Anything else that concerns you
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
## 2022-07-20 NOTE — ED Provider Notes (Signed)
4:43 PM Assumed care of patient from off-going team. For more details, please see note from same day.  In brief, this is a 31 y.o. female who is a transfer from [facility] for STAT EEG for seizure-like activity. Patient presented from jail w/ report of chronic benzo use, now with seizure like activity. Multiple episodes. Witnessed in the ED, atypical feature is minimal if any post ictal period.   Plan/Dispo at time of sign-out & ED Course since sign-out: [ ]  STAT EEG  BP 117/75   Pulse (!) 101   Temp 99.1 F (37.3 C) (Oral)   Resp 10   Ht 5' (1.524 m)   Wt 52.2 kg   LMP 06/27/2022 (Approximate)   SpO2 100%   BMI 22.46 kg/m    ED Course:   Clinical Course as of 07/21/22 0009  Wed Jul 20, 2022  1458 Reviewed the case with Dr. Jul 22, 2022.  Patient will require an EEG to evaluate further.  Patient will need to be transferred ED to ED for neurohospitalist evaluation and emergent EEG [JK]  1500 Radiology staff concerned doing plain spine films with her recurrent seizure.  Will proceed with CT scan of T and L as well as cervical [JK]  1501 CBC with Differential(!) [JK]  1525 Comprehensive metabolic panel(!) No significant electrolyte abnormalities [JK]  1525 CBC with Differential(!) CBC unremarkable [JK]  1525 Patient reexamined.  No seizure at this time.  Patient is alert and awake [JK]  1645 STAT EEG ordered [HN]  1806 Patient evaluated. Had an approximately 1 minute long episode of seizure activity with unresponsiveness, eye fluttering, pupillary dilation bilaterally, full body stiffening, increase in HR and desat to 86%. Lasted 1 min and stopped on its own. Patient not confused afterward but started crying stating she doesn't want to keep having seizures. Normal neuro exam afterward. Only half of EEG leads were in place at the time. Patient states she normally tastes pennies before she is about to have a seizure and she is tasting them constantly now. Only has had seizures when she is in  xanax withdrawal. Doesn't drink alcohol.  [HN]  1821 Patient refused the EEG, made the tech take the leads off. Stating she needs to go home. But patient would be taken to jail if she refuses treatment. Patient states she would like to leave. I discussed with patient and the guard at bedside that if patient had another seizure at jail she would be brought back here anyway, and that she incurs many risks of leaving including severe morbidity and mortality. Will treat patient with ativan and pain medication for her back and reapply EEG. Patient is in agreement. [HN]  1857 Patient again states she would like to leave AMA.  She states that she would like to be bonded out of jail and that she cannot make her phone call from here.  I confirmed with a guard at bedside that she is not allowed to use the phone here.  I strongly advised patient to stay and be further evaluated but she states that she would like to leave.  I informed the patient of the risks of leaving including further seizure activity, falls and head trauma, C-spine injuries or fractures, permanent brain damage, disability, and death.  Patient reports understanding of all of these risks and states that she would still like to leave in order to be bonded out of jail.  She states that once she is bonded out of jail she will have her Xanax and  will no longer be in withdrawal.  Patient demonstrates capacity of understanding of her risks and situation and will be leaving AGAINST MEDICAL ADVICE.  Patient is given discharge instructions and return precautions, informed her that even if she is bonded to jail and has access to her Xanax that if she has another seizure that she should come back to the emergency department.  Patient is advised not to drive, operate heavy machinery, or go swimming or take a bath.  She reports understanding.  All questions answered to patient satisfaction. [HN]    Clinical Course User Index [HN] Loetta Rough, MD [JK] Linwood Dibbles,  MD    Dispo: AMA ------------------------------- Vivi Barrack, MD Emergency Medicine  This note was created using dictation software, which may contain spelling or grammatical errors.   Loetta Rough, MD 07/21/22 [phone]

## 2022-07-20 NOTE — ED Notes (Signed)
Patient told Clinical research associate that she had 2 seizures since receiving Ativan 2 mg IV. Patient had taken her EKG off. Writer attempted to place the EKG back on and the patient pulled her jumpsuit closed and said that she was not putting it on until she got her 4th mg of Ativan. Dr. Lynelle Doctor notified and no new orders given.

## 2022-07-20 NOTE — ED Provider Triage Note (Cosign Needed)
Emergency Medicine Provider Triage Evaluation Note  Jenny Reed , a 31 y.o. female  was evaluated in triage.  Pt complains of seizure like activity. Deputy at bedside states patient had 10 back to back seizure like activity within 1.5 hour span. Longest lasted roughly 50 minutes. Previous seizures from benzo withdrawal. Last had xanax 2 nights ago. Has been in jail for 24 hours. Typically takes 4mg   xanax daily. Also uses heroin and cocaine. No urinary incontinence.  Not currently on any seizure medications.    Review of Systems  Positive: seizure Negative: fever  Physical Exam  BP 114/77 (BP Location: Left Arm)   Pulse 99   Temp 98.8 F (37.1 C) (Oral)   Resp 16   SpO2 100%  Gen:   Awake, no distress   Resp:  Normal effort  MSK:   Moves extremities without difficulty  Other:  Nonfocal neurological exam  Medical Decision Making  Medically screening exam initiated at 1:55 PM.  Appropriate orders placed.  Jenny Reed was informed that the remainder of the evaluation will be completed by another provider, this initial triage assessment does not replace that evaluation, and the importance of remaining in the ED until their evaluation is complete.  Labs CT head/cervical spine due to trauma   Judee Clara, PA-C 07/20/22 1357

## 2022-07-20 NOTE — ED Provider Notes (Cosign Needed)
Patient had seizure-like activity in hallway. Lasted 30ish seconds. Patient able to answer questions afterwards. No urinary incontinence or mouth trauma. Will give ativan in case of benzodiazapine withdrawal. Informed charge need for room.    Mannie Stabile, [place] 07/20/22 1405

## 2022-07-20 NOTE — Progress Notes (Signed)
Patient transferred to [facility].  Refusing EEG. Has had multiple episodes of seizure-like activity witnessed by ED with strong concern for PNES. I would recommend trying to convince her to get the study, characterize spells so that we can adequately and appropriately treat her. ED provider has talked to her.  She is signing out AMA. If she remains in the hospital, please call us back as needed.  I will cancel the EEG order. Discussed my plan with the ED provider  -- Milon Dikes, MD Neurologist [facility] Pager: [phone]

## 2022-07-20 NOTE — ED Triage Notes (Signed)
Pt via EMS from [facility] c/o seizure-like activity. Per [facility] staff, pt had what appeared to be a seizure for about 15 seconds  and fell backwards. No post ictal period noted and pt immediately returned to baseline. Hx opiate and benzo abuse.   BP 128/70 HR 96 O2 100% RA  CBG 162  A/O x 4 and ambulatory. Pt is in a c-collar

## 2022-07-20 NOTE — Consult Note (Signed)
Neurology Consultation  Reason for Consult: Seizures Referring Physician: Dr. Lynelle Doctor  CC: Seizures  History is obtained from: Patient, chart  HPI: Jenny Reed is a 31 y.o. female past medical history of polysubstance abuse, on chronic Xanax, who is now currently incarcerated for the last 2 days, coming into the emergency room for multiple seizures at the facility and then multiple seizures over in the emergency room.  Semiology described as head arching back with generalized shaking and then coming to without any amount of postictal.. She reports that she has been having seizures for a few months in the setting of benzodiazepine withdrawal.  Never had seizures as a child.  No prior history of head injury.  Neurology consulted due to multiplicity of events with concern for seizures-benzodiazepine withdrawal versus PNES.   ROS: Full ROS was performed and is negative except as noted in the HPI.  Past Medical History:  Diagnosis Date   H/O bladder infections    IV drug abuse (HCC)    Kidney stone    Mastitis    Renal disorder      Family History  Problem Relation Age of Onset   Seizures Mother      Social History:   reports that she has been smoking cigarettes. She has been smoking an average of .25 packs per day. She does not have any smokeless tobacco history on file. She reports current drug use. Drugs: IV and Cocaine. She reports that she does not drink alcohol.  Medications No current facility-administered medications for this encounter.  Current Outpatient Medications:    acetaminophen (TYLENOL) 500 MG tablet, Take 1,000 mg by mouth every 6 (six) hours as needed for moderate pain., Disp: , Rfl:    clindamycin (CLEOCIN) 300 MG capsule, Take 1 capsule (300 mg total) by mouth every 8 (eight) hours. (Patient not taking: Reported on 04/26/2015), Disp: 30 capsule, Rfl: 0 Exam: Current vital signs: BP 116/76   Pulse 94   Temp 99.1 F (37.3 C) (Oral)   Resp 10   Ht 5'  (1.524 m)   Wt 52.2 kg   LMP 06/27/2022 (Approximate)   SpO2 100%   BMI 22.46 kg/m  Vital signs in last 24 hours: Temp:  [98.8 F (37.1 C)-99.1 F (37.3 C)] 99.1 F (37.3 C) (12/27 1502) Pulse Rate:  [78-99] 94 (12/27 1545) Resp:  [10-17] 10 (12/27 1545) BP: (114-116)/(72-79) 116/76 (12/27 1545) SpO2:  [100 %] 100 % (12/27 1545) Weight:  [52.2 kg] 52.2 kg (12/27 1437) General: Sleeping in bed, awakens to voice HEENT: Normocephalic/atraumatic Lungs clear Cardiovascular: Regular rhythm Extremities warm well-perfused Neurological exam Awake alert oriented x 3 Somewhat tearful and wants to speak with family to pay her bone to get her released from custody. Speech is clear No evidence of dysarthria No evidence of aphasia Cranial nerves II to XII intact Motor examination with mild effort dependent weakness in all 4 extremities Sensation intact to light touch Coordination with no dysmetria No witnessed seizures at the time of this encounter  Labs I have reviewed labs in epic and the results pertinent to this consultation are: CBC    Component Value Date/Time   WBC 8.7 07/20/2022 1424   RBC 4.46 07/20/2022 1424   HGB 11.1 (L) 07/20/2022 1424   HCT 37.5 07/20/2022 1424   PLT 429 (H) 07/20/2022 1424   MCV 84.1 07/20/2022 1424   MCH 24.9 (L) 07/20/2022 1424   MCHC 29.6 (L) 07/20/2022 1424   RDW 14.6 07/20/2022 1424  LYMPHSABS 2.0 07/20/2022 1424   MONOABS 0.5 07/20/2022 1424   EOSABS 0.2 07/20/2022 1424   BASOSABS 0.0 07/20/2022 1424    CMP     Component Value Date/Time   NA 138 07/20/2022 1424   K 3.8 07/20/2022 1424   CL 109 07/20/2022 1424   CO2 23 07/20/2022 1424   GLUCOSE 107 (H) 07/20/2022 1424   BUN 10 07/20/2022 1424   CREATININE 0.42 (L) 07/20/2022 1424   CALCIUM 8.5 (L) 07/20/2022 1424   PROT 6.9 07/20/2022 1424   ALBUMIN 3.6 07/20/2022 1424   AST 131 (H) 07/20/2022 1424   ALT 96 (H) 07/20/2022 1424   ALKPHOS 73 07/20/2022 1424   BILITOT 0.4  07/20/2022 1424   GFRNONAA >60 07/20/2022 1424   GFRAA >60 06/13/2015 1505     Imaging I have reviewed the images obtained:  CT-head:pending   Assessment: 31 year old with history of polysubstance abuse and history of withdrawal seizures after cessation of Xanax, presenting with multiple seizure-like episodes to the ER.  According to the ED provider, one of the witnessed events by him had a tonic-clonic seizure semiology but no ensuing postictal state and she came to without any delay. Concern for PNES versus benzodiazepine withdrawal seizure given history of seizures in the setting of benzodiazepine withdrawal in the past. Will need neuromonitoring as well as EEG-see recommendations below  Impression Seizure-like activity-PNES versus benzodiazepine withdrawal seizure Requires further evaluation due to multiplicity of events.  Recommendations: She has received Ativan x 2. I would start her on Xanax 0.25 twice daily Transfer to [facility].  We will hook her up to LTM EEG for seizure spell characterization I would hold off on antiepileptics unless she has another seizure episode.  If she has multiple other seizures, will consider loading her with Keppra. Check chest x-ray, urinalysis and urinary drug screen   -- Milon Dikes, MD Neurologist [facility] Pager: [phone]

## 2022-07-20 NOTE — ED Provider Notes (Signed)
[facility]-EMERGENCY DEPT Provider Note   CSN: [id number] Arrival date & time: 07/20/22  1340     History  No chief complaint on file.   Jenny Reed is a 31 y.o. female.  HPI   Patient has a history of substance abuse disorder, kidney stones.  Patient admits to using cocaine heroin and Xanax illegally.  Patient states she was taking it regularly.  She has been incarcerated for the last 2 days.  Patient was brought to the ED for evaluation after possible seizure-like activity at jail.  Patient has had approximately 10 episodes within a couple of hours.  These episodes last minutes at a time.  Patient will return to baseline in between her episodes.  While in the ED patient had episodes of seizure-like activity.  Home Medications Prior to Admission medications   Medication Sig Start Date End Date Taking? Authorizing Provider  acetaminophen (TYLENOL) 500 MG tablet Take 1,000 mg by mouth every 6 (six) hours as needed for moderate pain.    [provider]  clindamycin (CLEOCIN) 300 MG capsule Take 1 capsule (300 mg total) by mouth every 8 (eight) hours. Patient not taking: Reported on 04/26/2015 01/27/15   Marlon Pel, PA-C      Allergies    Aspirin and Bactrim [sulfamethoxazole-trimethoprim]    Review of Systems   Review of Systems  Physical Exam Updated Vital Signs BP 115/79   Pulse 78   Temp 99.1 F (37.3 C) (Oral)   Resp 17   Ht 1.524 m (5')   Wt 52.2 kg   LMP 06/27/2022 (Approximate)   SpO2 100%   BMI 22.46 kg/m  Physical Exam Vitals and nursing note reviewed.  Constitutional:      Appearance: She is well-developed. She is not diaphoretic.  HENT:     Head: Normocephalic and atraumatic.     Right Ear: External ear normal.     Left Ear: External ear normal.  Eyes:     General: No scleral icterus.       Right eye: No discharge.        Left eye: No discharge.     Conjunctiva/sclera: Conjunctivae normal.  Neck:     Trachea:  No tracheal deviation.  Cardiovascular:     Rate and Rhythm: Normal rate and regular rhythm.  Pulmonary:     Effort: Pulmonary effort is normal. No respiratory distress.     Breath sounds: Normal breath sounds. No stridor. No wheezing or rales.  Abdominal:     General: Bowel sounds are normal. There is no distension.     Palpations: Abdomen is soft.     Tenderness: There is no abdominal tenderness. There is no guarding or rebound.  Musculoskeletal:        General: No tenderness or deformity.     Cervical back: Neck supple.  Skin:    General: Skin is warm and dry.     Findings: No rash.  Neurological:     General: No focal deficit present.     Mental Status: She is alert.     Cranial Nerves: No cranial nerve deficit, dysarthria or facial asymmetry.     Sensory: No sensory deficit.     Motor: Seizure activity present. No abnormal muscle tone.     Coordination: Coordination normal.     Comments: Seizure-like activity witnessed at the bedside, patient tensed up with all extremities extended, she was unresponsive.  Episode lasted a minute or 2.  Patient immediately responded to  my questions and was able to answer clearly after the episode resolved.  No postictal state  Psychiatric:        Mood and Affect: Mood normal.     ED Results / Procedures / Treatments   Labs (all labs ordered are listed, but only abnormal results are displayed) Labs Reviewed  CBC WITH DIFFERENTIAL/PLATELET - Abnormal; Notable for the following components:      Result Value   Hemoglobin 11.1 (*)    MCH 24.9 (*)    MCHC 29.6 (*)    Platelets 429 (*)    All other components within normal limits  COMPREHENSIVE METABOLIC PANEL - Abnormal; Notable for the following components:   Glucose, Bld 107 (*)    Creatinine, Ser 0.42 (*)    Calcium 8.5 (*)    AST 131 (*)    ALT 96 (*)    All other components within normal limits  CBG MONITORING, ED - Abnormal; Notable for the following components:   Glucose-Capillary  109 (*)    All other components within normal limits  ETHANOL  RAPID URINE DRUG SCREEN, HOSP PERFORMED  I-STAT BETA HCG BLOOD, ED ([facility], [facility], [facility] ONLY)    EKG EKG Interpretation  Date/Time:  Wednesday July 20 2022 14:45:13 EST Ventricular Rate:  98 PR Interval:  132 QRS Duration: 84 QT Interval:  340 QTC Calculation: 435 R Axis:   69 Text Interpretation: Sinus rhythm Confirmed by Linwood Dibbles [phone]) on 07/20/2022 3:13:46 PM  Radiology No results found.  Procedures Procedures    Medications Ordered in ED Medications  LORazepam (ATIVAN) injection 1 mg (1 mg Intravenous Given 07/20/22 1430)  LORazepam (ATIVAN) injection 2 mg (2 mg Intravenous Given 07/20/22 1445)    ED Course/ Medical Decision Making/ A&P Clinical Course as of 07/20/22 1528  Wed Jul 20, 2022  1458 Reviewed the case with Dr. Jerrell Belfast.  Patient will require an EEG to evaluate further.  Patient will need to be transferred ED to ED for neurohospitalist evaluation and emergent EEG [JK]  1500 Radiology staff concerned doing plain spine films with her recurrent seizure.  Will proceed with CT scan of T and L as well as cervical [JK]  1501 CBC with Differential(!) [JK]  1525 Comprehensive metabolic panel(!) No significant electrolyte abnormalities [JK]  1525 CBC with Differential(!) CBC unremarkable [JK]  1525 Patient reexamined.  No seizure at this time.  Patient is alert and awake [JK]    Clinical Course User Index [JK] Linwood Dibbles, MD                           Medical Decision Making Problems Addressed: Substance use disorder: chronic illness or injury with exacerbation, progression, or side effects of treatment Witnessed seizure-like activity [facility]): acute illness or injury that poses a threat to life or bodily functions  Amount and/or Complexity of Data Reviewed Labs:  Decision-making details documented in ED Course. Radiology: ordered.  Risk Prescription drug management.   Patient presented to ED for  evaluation of seizure-like activity.  Symptoms started today.  She does have a history of substance use disorder and chronic illicit benzodiazepine use.  Patient had seizure-like activity in the ED.  No postictal state noted after her episode that I observed.  However she is at risk for seizures with her chronic benzodiazepine use.  I discussed case with Dr. Jerrell Belfast neurohospitalist.  We will plan on ED to ED transfer to [facility] so she can have stat  EEG.  I discussed this with the patient as well as the officer monitoring her to make them aware of the plan.  Dr Jearld Fenton and Rancour aware of plan for ED transfer.  CT scans ordered and pending at shift change to rule out serious injury associated with her fall at jail related to her seizure        Final Clinical Impression(s) / ED Diagnoses Final diagnoses:  Witnessed seizure-like activity (HCC)  Substance use disorder    Rx / DC Orders ED Discharge Orders     None         Linwood Dibbles, MD 07/20/22 1528

## 2022-07-21 ENCOUNTER — Observation Stay (HOSPITAL_COMMUNITY)

## 2022-07-21 ENCOUNTER — Emergency Department (HOSPITAL_COMMUNITY)

## 2022-07-21 ENCOUNTER — Other Ambulatory Visit: Payer: Self-pay

## 2022-07-21 ENCOUNTER — Encounter (HOSPITAL_COMMUNITY): Payer: Self-pay | Admitting: Internal Medicine

## 2022-07-21 ENCOUNTER — Observation Stay (HOSPITAL_COMMUNITY): Admission: EM | Admit: 2022-07-21 | Discharge: 2022-07-22 | Attending: Internal Medicine | Admitting: Internal Medicine

## 2022-07-21 DIAGNOSIS — R569 Unspecified convulsions: Secondary | ICD-10-CM

## 2022-07-21 DIAGNOSIS — F1410 Cocaine abuse, uncomplicated: Secondary | ICD-10-CM

## 2022-07-21 DIAGNOSIS — F1123 Opioid dependence with withdrawal: Secondary | ICD-10-CM

## 2022-07-21 DIAGNOSIS — F1310 Sedative, hypnotic or anxiolytic abuse, uncomplicated: Secondary | ICD-10-CM

## 2022-07-21 LAB — CBC WITH DIFFERENTIAL/PLATELET
Abs Immature Granulocytes: 0.02 10*3/uL (ref 0.00–0.07)
Basophils Absolute: 0 10*3/uL (ref 0.0–0.1)
Basophils Relative: 0 %
Eosinophils Absolute: 0.1 10*3/uL (ref 0.0–0.5)
Eosinophils Relative: 1 %
HCT: 34.4 % — ABNORMAL LOW (ref 36.0–46.0)
Hemoglobin: 10.3 g/dL — ABNORMAL LOW (ref 12.0–15.0)
Immature Granulocytes: 0 %
Lymphocytes Relative: 24 %
Lymphs Abs: 2 10*3/uL (ref 0.7–4.0)
MCH: 25.3 pg — ABNORMAL LOW (ref 26.0–34.0)
MCHC: 29.9 g/dL — ABNORMAL LOW (ref 30.0–36.0)
MCV: 84.5 fL (ref 80.0–100.0)
Monocytes Absolute: 0.5 10*3/uL (ref 0.1–1.0)
Monocytes Relative: 6 %
Neutro Abs: 5.8 10*3/uL (ref 1.7–7.7)
Neutrophils Relative %: 69 %
Platelets: 394 10*3/uL (ref 150–400)
RBC: 4.07 MIL/uL (ref 3.87–5.11)
RDW: 14.7 % (ref 11.5–15.5)
WBC: 8.5 10*3/uL (ref 4.0–10.5)
nRBC: 0 % (ref 0.0–0.2)

## 2022-07-21 LAB — BASIC METABOLIC PANEL
Anion gap: 4 — ABNORMAL LOW (ref 5–15)
BUN: 11 mg/dL (ref 6–20)
CO2: 25 mmol/L (ref 22–32)
Calcium: 9 mg/dL (ref 8.9–10.3)
Chloride: 110 mmol/L (ref 98–111)
Creatinine, Ser: 0.61 mg/dL (ref 0.44–1.00)
GFR, Estimated: >60 mL/min (ref >60)
Glucose, Bld: 126 mg/dL — ABNORMAL HIGH (ref 70–99)
Potassium: 3.6 mmol/L (ref 3.5–5.1)
Sodium: 139 mmol/L (ref 135–145)

## 2022-07-21 LAB — MAGNESIUM: Magnesium: 1.9 mg/dL (ref 1.7–2.4)

## 2022-07-21 MED ORDER — ONDANSETRON HCL 4 MG PO TABS
4.0000 mg | ORAL_TABLET | Freq: Three times a day (TID) | ORAL | Status: DC | PRN
  Administered 2022-07-22: 4 mg via ORAL
  Filled 2022-07-21: qty 1

## 2022-07-21 MED ORDER — CLONIDINE HCL 0.1 MG PO TABS
0.1000 mg | ORAL_TABLET | Freq: Four times a day (QID) | ORAL | Status: DC
  Administered 2022-07-21 – 2022-07-22 (×5): 0.1 mg via ORAL
  Filled 2022-07-21 (×5): qty 1

## 2022-07-21 MED ORDER — ZIPRASIDONE MESYLATE 20 MG IM SOLR
10.0000 mg | Freq: Once | INTRAMUSCULAR | Status: AC
  Administered 2022-07-21: 10 mg via INTRAMUSCULAR
  Filled 2022-07-21: qty 20

## 2022-07-21 MED ORDER — CLONIDINE HCL 0.1 MG PO TABS: 0.1000 mg | ORAL_TABLET | Freq: Every day | ORAL | Status: DC

## 2022-07-21 MED ORDER — DICYCLOMINE HCL 20 MG PO TABS: 20.0000 mg | ORAL_TABLET | Freq: Four times a day (QID) | ORAL | Status: DC | PRN

## 2022-07-21 MED ORDER — LORAZEPAM 2 MG/ML IJ SOLN
4.0000 mg | INTRAMUSCULAR | Status: DC | PRN
  Filled 2022-07-21: qty 2

## 2022-07-21 MED ORDER — LORAZEPAM 1 MG PO TABS: 0.0000 mg | ORAL_TABLET | Freq: Two times a day (BID) | ORAL | Status: DC

## 2022-07-21 MED ORDER — NICOTINE 14 MG/24HR TD PT24
14.0000 mg | MEDICATED_PATCH | Freq: Every day | TRANSDERMAL | Status: DC
  Administered 2022-07-22: 14 mg via TRANSDERMAL
  Filled 2022-07-21: qty 1

## 2022-07-21 MED ORDER — LOPERAMIDE HCL 2 MG PO CAPS
2.0000 mg | ORAL_CAPSULE | ORAL | Status: DC | PRN
  Administered 2022-07-22: 2 mg via ORAL
  Filled 2022-07-21: qty 1

## 2022-07-21 MED ORDER — STERILE WATER FOR INJECTION IJ SOLN
INTRAMUSCULAR | Status: AC
  Filled 2022-07-21: qty 10

## 2022-07-21 MED ORDER — ACETAMINOPHEN 650 MG RE SUPP: 650.0000 mg | Freq: Four times a day (QID) | RECTAL | Status: DC | PRN

## 2022-07-21 MED ORDER — HALOPERIDOL 1 MG PO TABS: 5.0000 mg | ORAL_TABLET | Freq: Four times a day (QID) | ORAL | Status: DC | PRN

## 2022-07-21 MED ORDER — LORAZEPAM 2 MG/ML IJ SOLN: 0.0000 mg | Freq: Four times a day (QID) | INTRAMUSCULAR | Status: DC

## 2022-07-21 MED ORDER — BUPRENORPHINE HCL 2 MG SL SUBL: 2.0000 mg | SUBLINGUAL_TABLET | Freq: Every day | SUBLINGUAL | Status: DC

## 2022-07-21 MED ORDER — ACETAMINOPHEN 325 MG PO TABS
650.0000 mg | ORAL_TABLET | Freq: Four times a day (QID) | ORAL | Status: DC | PRN
  Administered 2022-07-22: 650 mg via ORAL
  Filled 2022-07-21: qty 2

## 2022-07-21 MED ORDER — METHOCARBAMOL 500 MG PO TABS
500.0000 mg | ORAL_TABLET | Freq: Three times a day (TID) | ORAL | Status: DC | PRN
  Administered 2022-07-21 – 2022-07-22 (×3): 500 mg via ORAL
  Filled 2022-07-21 (×3): qty 1

## 2022-07-21 MED ORDER — ALBUTEROL SULFATE (2.5 MG/3ML) 0.083% IN NEBU: 2.5000 mg | INHALATION_SOLUTION | RESPIRATORY_TRACT | Status: DC | PRN

## 2022-07-21 MED ORDER — HYDRALAZINE HCL 20 MG/ML IJ SOLN: 5.0000 mg | INTRAMUSCULAR | Status: DC | PRN

## 2022-07-21 MED ORDER — ALUM & MAG HYDROXIDE-SIMETH 200-200-20 MG/5ML PO SUSP: 30.0000 mL | Freq: Four times a day (QID) | ORAL | Status: DC | PRN

## 2022-07-21 MED ORDER — LORAZEPAM 1 MG PO TABS: 0.0000 mg | ORAL_TABLET | Freq: Four times a day (QID) | ORAL | Status: DC

## 2022-07-21 MED ORDER — BENZTROPINE MESYLATE 1 MG PO TABS: 1.0000 mg | ORAL_TABLET | Freq: Four times a day (QID) | ORAL | Status: DC | PRN

## 2022-07-21 MED ORDER — LORAZEPAM 2 MG/ML IJ SOLN: 0.0000 mg | Freq: Two times a day (BID) | INTRAMUSCULAR | Status: DC

## 2022-07-21 MED ORDER — HYDROXYZINE HCL 25 MG PO TABS: 25.0000 mg | ORAL_TABLET | Freq: Four times a day (QID) | ORAL | Status: DC | PRN

## 2022-07-21 MED ORDER — SODIUM CHLORIDE 0.9% FLUSH
3.0000 mL | Freq: Two times a day (BID) | INTRAVENOUS | Status: DC
  Administered 2022-07-22 (×2): 3 mL via INTRAVENOUS

## 2022-07-21 MED ORDER — STERILE WATER FOR INJECTION IJ SOLN
INTRAMUSCULAR | Status: AC
  Administered 2022-07-21: 1 mL
  Filled 2022-07-21: qty 10

## 2022-07-21 MED ORDER — THIAMINE HCL 100 MG/ML IJ SOLN: 100.0000 mg | Freq: Every day | INTRAMUSCULAR | Status: DC

## 2022-07-21 MED ORDER — LORAZEPAM 2 MG/ML IJ SOLN
0.5000 mg | Freq: Once | INTRAMUSCULAR | Status: DC
  Filled 2022-07-21: qty 1

## 2022-07-21 MED ORDER — ENOXAPARIN SODIUM 40 MG/0.4ML IJ SOSY: 40.0000 mg | PREFILLED_SYRINGE | INTRAMUSCULAR | Status: DC

## 2022-07-21 MED ORDER — LACTATED RINGERS IV SOLN: INTRAVENOUS | Status: DC

## 2022-07-21 MED ORDER — LORAZEPAM 2 MG/ML IJ SOLN
0.5000 mg | Freq: Once | INTRAMUSCULAR | Status: AC
  Administered 2022-07-21: 0.5 mg via INTRAMUSCULAR

## 2022-07-21 MED ORDER — THIAMINE MONONITRATE 100 MG PO TABS: 100.0000 mg | ORAL_TABLET | Freq: Every day | ORAL | Status: DC

## 2022-07-21 MED ORDER — CLONIDINE HCL 0.1 MG PO TABS: 0.1000 mg | ORAL_TABLET | Freq: Two times a day (BID) | ORAL | Status: DC

## 2022-07-21 NOTE — H&P (Signed)
History and Physical    Patient: Jenny Reed [id number] DOB: 1991-01-14 DOA: 07/21/2022 DOS: the patient was seen and examined on 07/21/2022 PCP: Pcp, No  Patient coming from:  [place]     Chief Complaint: Seizure-like activity  HPI: Jenny Reed is a 31 y.o. female with medical history significant of IVDA and h/o seizure-like activity presenting with seizure-like activity. She reports severe withdrawals and diffuse pain from withdrawal from heroin, cocaine, and BZD.  She does not desire to quit at this time.  She previously tried suboxone and does not wish to use this again.  She reports that she is miserable.  She was incarcerated 2-3 days ago and used prior to that.  She was seen in the [facility] ER yesterday for seizure-like activity at jail with maybe 10 back to back seizures in 90 minutes.    She had another episode in the hallway at the ER lasting about 30 seconds and was immediately able to answer questions following the episode.  She was arranged to transfer to [facility] for stat EEG.  She was transferred to [facility] and had a 1 minute long episode of seizure-like activity with immediate return to consciousness without confusion, although she was crying and saying she didn't want to continue to have seizures.  She asked to leave but was mollified with ativan and pain medication and agreed to have EEG placed.  She then decided again to leave AMA about 30 minutes later.  Her plan was to return to jail, make her 1 telephone call to get bonded out, and resume using since she believes that is what is causing her seizures.  She was allowed to sign out AMA but was notified that she would be returned to the ER should additional seizure activity take place.  After her return to the jail, she had another seizure-like episode and was brought back to the ER.  She had another episode while she was in the ER overnight.    ER Course:  Seizure vs. Pseudoseizure.  Negative EEG.  H/o heroin, cocaine, Xanax  abuse.  Jail x 2 days ago.  Went to [employer] 2 days ago with seizure-like activity and left without completing work up.  Here with withdrawal.     Review of Systems: As mentioned in the history of present illness. All other systems reviewed and are negative. Past Medical History:  Diagnosis Date   H/O bladder infections    IV drug abuse (HCC)    Kidney stone    Mastitis    Renal disorder    Past Surgical History:  Procedure Laterality Date   CESAREAN SECTION     TOOTH EXTRACTION     Social History:  reports that she has been smoking cigarettes. She has been smoking an average of .25 packs per day. She does not have any smokeless tobacco history on file. She reports current drug use. Drugs: IV, Cocaine, Heroin, and Benzodiazepines. She reports that she does not drink alcohol.  Allergies  Allergen Reactions   Aspirin Other (See Comments)    Pt states that this medication causes stomach pain.     Bactrim [Sulfamethoxazole-Trimethoprim] Hives and Swelling    Family History  Problem Relation Age of Onset   Seizures Mother     Prior to Admission medications   Medication Sig Start Date End Date Taking? Authorizing Provider  acetaminophen (TYLENOL) 500 MG tablet Take 1,000 mg by mouth every 6 (six) hours as needed for moderate pain.    [provider]  clindamycin (CLEOCIN) 300 MG capsule Take 1 capsule (300 mg total) by mouth every 8 (eight) hours. Patient not taking: Reported on 04/26/2015 01/27/15   Delos Haring, PA-C    Physical Exam: Vitals:   07/21/22 0111 07/21/22 1059 07/21/22 1523 07/21/22 1530  BP: 105/62 124/69 115/70 131/83  Pulse: 60 95 98 98  Resp: 18 14    Temp: 99.1 F (37.3 C) 98 F (36.7 C)  98.2 F (36.8 C)  SpO2: 98% 99% 99% 100%   General:  Appears disheveled, uncomfortable, somewhat combative, shackled to the bed Eyes:  EOMI, normal lids, iris ENT:  grossly normal hearing, lips & tongue without apparent tongue trauma, mmm; poor dentition Neck:   no LAD, masses or thyromegaly Cardiovascular:  RRR, no m/r/g. No LE edema.  Respiratory:   CTA bilaterally with no wheezes/rales/rhonchi.  Normal respiratory effort. Abdomen:  soft, NT, ND Skin:  no rash or induration seen on limited exam Musculoskeletal:  grossly normal tone BUE/BLE, good ROM, no bony abnormality Psychiatric:  agitated mood and affect, speech fluent and appropriate, AOx3 Neurologic:  CN 2-12 grossly intact, moves all extremities in coordinated fashion   Radiological Exams on Admission: Independently reviewed - see discussion in A/P where applicable  CT Head Wo Contrast  Result Date: 07/21/2022 CLINICAL DATA:  Mental status change.  Seizure. EXAM: CT HEAD WITHOUT CONTRAST TECHNIQUE: Contiguous axial images were obtained from the base of the skull through the vertex without intravenous contrast. RADIATION DOSE REDUCTION: This exam was performed according to the departmental dose-optimization program which includes automated exposure control, adjustment of the mA and/or kV according to patient size and/or use of iterative reconstruction technique. COMPARISON:  CT head without contrast 10/12/2021 at [place]: Brain: No acute infarct, hemorrhage, or mass lesion is present. No significant white matter lesions are present. Deep brain nuclei are within normal limits. The ventricles are of normal size. No significant extraaxial fluid collection is present. The brainstem and cerebellum are within normal limits. Vascular: No hyperdense vessel or unexpected calcification. Skull: Calvarium is intact. No focal lytic or blastic lesions are present. No significant extracranial soft tissue lesion is present. Sinuses/Orbits: The paranasal sinuses and mastoid air cells are clear. The globes and orbits are within normal limits. IMPRESSION: Negative CT of the head. Electronically Signed   By: San Morelle M.D.   On: 07/21/2022 15:22   EEG adult  Result Date:  07/21/2022 Jenny Havens, MD     07/21/2022  2:46 PM Patient Name: MAKENSLEY SPENCER MRN: [id number] Epilepsy Attending: Lora Reed Referring Physician/Provider: Lennice Sites, DO Date: 07/21/2022 Duration: 23.17 mins Patient history: 31 year old female with seizure-like activity.  EEG to evaluate for seizure. Level of alertness: Awake, asleep AEDs during EEG study: Ativan Technical aspects: This EEG study was done with scalp electrodes positioned according to the 10-20 International system of electrode placement. Electrical activity was reviewed with band pass filter of 1-70Hz , sensitivity of 7 uV/mm, display speed of 80mm/sec with a 60Hz  notched filter applied as appropriate. EEG data were recorded continuously and digitally stored.  Video monitoring was available and reviewed as appropriate. Description: No clear posterior dominant rhythm was seen. Sleep was characterized by vertex waves, sleep spindles (12 to 14 Hz), maximal frontocentral region. There is an excessive amount of 15 to 18 Hz beta activity distributed symmetrically and diffusely. Hyperventilation and photic stimulation were not performed.   ABNORMALITY - Excessive beta, generalized IMPRESSION: This study is within normal limits. The excessive beta  activity seen in the background is most likely due to the effect of benzodiazepine and is a benign EEG pattern. No seizures or epileptiform discharges were seen throughout the recording. Priyanka Barbra Sarks    EKG: not done today   Labs on Admission: I have personally reviewed the available labs and imaging studies at the time of the admission.  Pertinent labs:    Glucose 126 WBC 8.5 Hgb 10.3   Assessment and Plan: Principal Problem:   Seizure-like activity (HCC)    Seizure like activity -Patient with known h/o polysubstance abuse, currently in jail and no use in 2-3 days, presenting with frequent seizure-like activity  -Negative EEG -There is question of nonorganic etiology;  patient to be placed on LTM EEG to try to observe brain wave activity during episodes -Patient placed in observation overnight for further evaluation -Neurology has seen the patient -Seizure precautions -Ativan prn - try to avoid if possible  IVDA/polysubstance abuse -Long-standing heroin/cocaine/BZD dependence -She reports NO desire to quit and NO desire for Suboxone -Will monitor on COWS protocol (5-12 mild symptoms; 13-24 moderate symptoms; 25-36 moderately severe symptoms; >36 severe symptoms) -TOC team consult for substance abuse education/support -prn orders from the Clonidine withdrawal order set were also ordered. -She is presenting from jail and will need to return to jail at the time of dc  Tobacco abuse -Encourage cessation.    -Patch ordered    Advance Care Planning:   Code Status: Full Code - Code status was discussed with the patient and/or family at the time of admission.  The patient would want to receive full resuscitative measures at this time.   Consults: Neurology; [facility] team  DVT Prophylaxis: Lovenox  Family Communication: Presenting from jail  Severity of Illness: The appropriate patient status for this patient is OBSERVATION. Observation status is judged to be reasonable and necessary in order to provide the required intensity of service to ensure the patient's safety. The patient's presenting symptoms, physical exam findings, and initial radiographic and laboratory data in the context of their medical condition is felt to place them at decreased risk for further clinical deterioration. Furthermore, it is anticipated that the patient will be medically stable for discharge from the hospital within 2 midnights of admission.   Author: Karmen Bongo, MD 07/21/2022 4:12 PM  For on call review [email or web address].

## 2022-07-21 NOTE — Plan of Care (Signed)
Patient with concern for seizure-like episodes-etiology PNES versus benzo withdrawal seizures-seen emergently at [facility] yesterday and LTM EEG recommended.  Patient left AMA and returned back with more seizures. I recommend getting routine EEG and we will follow-up.   -- Milon Dikes, MD Neurologist [facility] Pager: [phone]

## 2022-07-21 NOTE — Procedures (Signed)
Patient Name: Jenny Reed  MRN: [id number]  Epilepsy Attending: Charlsie Quest  Referring Physician/Provider: Virgina Norfolk, DO  Date: 07/21/2022  Duration: 23.17 mins  Patient history: 31 year old female with seizure-like activity.  EEG to evaluate for seizure.  Level of alertness: Awake, asleep  AEDs during EEG study: Ativan  Technical aspects: This EEG study was done with scalp electrodes positioned according to the 10-20 International system of electrode placement. Electrical activity was reviewed with band pass filter of 1-70Hz , sensitivity of 7 uV/mm, display speed of 21mm/sec with a 60Hz  notched filter applied as appropriate. EEG data were recorded continuously and digitally stored.  Video monitoring was available and reviewed as appropriate.  Description: No clear posterior dominant rhythm was seen. Sleep was characterized by vertex waves, sleep spindles (12 to 14 Hz), maximal frontocentral region. There is an excessive amount of 15 to 18 Hz beta activity distributed symmetrically and diffusely. Hyperventilation and photic stimulation were not performed.     ABNORMALITY - Excessive beta, generalized  IMPRESSION: This study is within normal limits. The excessive beta activity seen in the background is most likely due to the effect of benzodiazepine and is a benign EEG pattern. No seizures or epileptiform discharges were seen throughout the recording.  Jenny Reed 

## 2022-07-21 NOTE — ED Provider Notes (Signed)
Was called to lobby by police for seizure-like activity.  When I evaluated patient she was on the stretcher, rigid all 4 extremities, shaking, eyes rolled into the back of her head, tearing of bilateral eyes. Would not follow commands.  Lasted less than 1 minute, no tongue injury, bladder incontinence however appeared postictal.  Charge nurse Shanda Bumps made aware   Linwood Dibbles, PA-C 07/21/22 0442    Nira Conn, MD 07/21/22 [phone]
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
## 2022-07-21 NOTE — ED Notes (Signed)
Pt transported to CT ?

## 2022-07-21 NOTE — ED Provider Triage Note (Signed)
Emergency Medicine Provider Triage Evaluation Note  Jenny Reed , a 31 y.o. female  was evaluated in triage.  Pt complains of seizure-like activity.  Patient arrives with police.  Currently in custody.  Apparently has had seizure-like activity.  She was seen at [facility] yesterday transferred to [facility] for EEG.  Upon transfer patient had declined EEG and left AMA.  Apparently takes 4 mg of Xanax daily.  Review of Systems  Positive: Seizure like activity Negative: Fever, HA  Physical Exam  LMP 06/27/2022 (Approximate)  Gen:   Awake, no distress   Resp:  Normal effort  MSK:   Moves extremities without difficulty  Other:    Medical Decision Making  Medically screening exam initiated at 1:04 AM.  Appropriate orders placed.  Jenny Reed was informed that the remainder of the evaluation will be completed by another provider, this initial triage assessment does not replace that evaluation, and the importance of remaining in the ED until their evaluation is complete.  Seizure-like activity.  She is agreeable for EEG now after declining yesterday after being seen in the emergency department, subsequently leaving AMA   Gale Klar A, PA-C 07/21/22 0106

## 2022-07-21 NOTE — Progress Notes (Signed)
EEG complete - results pending 

## 2022-07-21 NOTE — ED Notes (Signed)
Taken to CT by CT tech.

## 2022-07-21 NOTE — ED Notes (Signed)
Pt. In the hallway with the sheriffs dept screaming continuous and stated, I have to scream so I will get some help.

## 2022-07-21 NOTE — Consult Note (Signed)
Neurology Consultation   Reason for Consult: Seizures Referring Physician: Dr. Lynelle Doctor   CC: Seizures   History is obtained from: Patient, chart   HPI: 31 year old past history of polysubstance abuse including heroin and Xanax-that she admitted to today, incarcerated for the last 3 days coming for multiple seizure-like episodes.  Seen emergently yesterday at [facility]-see HPI below. Left AMA.  Came back again with more seizures witnessed by the [facility] staff.  Received multiple doses of benzo yesterday.  Today received 2 doses of Geodon, and in between extremely agitated and also had witnessed seizure activity-unclear how many.  HPI from my consult note yesterday:  Jenny Reed is a 31 y.o. female past medical history of polysubstance abuse, on chronic Xanax, who is now currently incarcerated for the last 2 days, coming into the emergency room for multiple seizures at the facility and then multiple seizures over in the emergency room.  Semiology described as head arching back with generalized shaking and then coming to without any amount of postictal.. She reports that she has been having seizures for a few months in the setting of benzodiazepine withdrawal.  Never had seizures as a child.  No prior history of head injury. Neurology consulted due to multiplicity of events with concern for seizures-benzodiazepine withdrawal versus PNES.     ROS: Full ROS was performed and is negative except as noted in the HPI.       Past Medical History:  Diagnosis Date   H/O bladder infections     IV drug abuse (HCC)     Kidney stone     Mastitis     Renal disorder               Family History  Problem Relation Age of Onset   Seizures Mother          Social History:   reports that she has been smoking cigarettes. She has been smoking an average of .25 packs per day. She does not have any smokeless tobacco history on file. She reports current drug use. Drugs: IV and Cocaine. She  reports that she does not drink alcohol.   Medications No current facility-administered medications for this encounter.  Current Facility-Administered Medications:    LORazepam (ATIVAN) injection 0.5 mg, 0.5 mg, Intravenous, Once, Curatolo, Adam, DO   ziprasidone (GEODON) injection 10 mg, 10 mg, Intramuscular, Once, Curatolo, Adam, DO  Current Outpatient Medications:    acetaminophen (TYLENOL) 500 MG tablet, Take 1,000 mg by mouth every 6 (six) hours as needed for moderate pain., Disp: , Rfl:    clindamycin (CLEOCIN) 300 MG capsule, Take 1 capsule (300 mg total) by mouth every 8 (eight) hours. (Patient not taking: Reported on 04/26/2015), Disp: 30 capsule, Rfl: 0  Exam:    07/21/2022   10:59 AM 07/21/2022    1:11 AM 07/20/2022    7:14 PM  Vitals with BMI  Systolic 124 105 409  Diastolic 69 62 77  Pulse 95 60 95    General: Sleeping in bed, awakens to voice HEENT: Normocephalic/atraumatic Lungs clear Cardiovascular: Regular rhythm Extremities warm well-perfused Neurological exam Awake alert oriented x 3 Somewhat tearful and wants to speak with family to pay her bone to get her released from custody. Speech is clear No evidence of dysarthria No evidence of aphasia Cranial nerves II to XII intact Motor examination with mild effort dependent weakness in all 4 extremities Sensation intact to light touch Coordination with no dysmetria No witnessed seizures at the time  of this encounter   Labs I have reviewed labs in epic and the results pertinent to this consultation are:    Latest Ref Rng & Units 07/21/2022    1:16 AM 07/20/2022    2:24 PM 06/13/2015    3:05 PM  CBC  WBC 4.0 - 10.5 K/uL 8.5  8.7  11.1   Hemoglobin 12.0 - 15.0 g/dL 10.3  11.1  12.0   Hematocrit 36.0 - 46.0 % 34.4  37.5  38.3   Platelets 150 - 400 K/uL 394  429  444       Latest Ref Rng & Units 07/21/2022    1:16 AM 07/20/2022    2:24 PM 06/13/2015    3:05 PM  CMP  Glucose 70 - 99 mg/dL 126  107   103   BUN 6 - 20 mg/dL 11  10  11    Creatinine 0.44 - 1.00 mg/dL 0.61  0.42  0.84   Sodium 135 - 145 mmol/L 139  138  141   Potassium 3.5 - 5.1 mmol/L 3.6  3.8  3.4   Chloride 98 - 111 mmol/L 110  109  106   CO2 22 - 32 mmol/L 25  23  24    Calcium 8.9 - 10.3 mg/dL 9.0  8.5  9.3   Total Protein 6.5 - 8.1 g/dL  6.9  6.6   Total Bilirubin 0.3 - 1.2 mg/dL  0.4  0.2   Alkaline Phos 38 - 126 U/L  73  70   AST 15 - 41 U/L  131  56   ALT 0 - 44 U/L  96  29       Imaging I have reviewed the images obtained:   CT-head:pending   Stat EEG: Excessive beta likely medication side effect from benzos, otherwise normal.     Assessment: 31 year old with history of polysubstance abuse and self-reported history of withdrawal seizures after cessation of Xanax in the past, presenting with multiple seizure-like episodes to the ER.   Seen emergently at [facility], ER yesterday for concern for seizure-like episodes-strong suspicion for PNES.  Recommended LTM EEG which she refused and left AMA. Came back with lawn for cement with recurrent seizures at the facility. To the ED provider today, admits to using heroin almost daily for the past 17 years and Xanax also daily for past many years with last dose of Xanax prior to incarceration 3 nights ago. Since has received multiple IV doses of Ativan.  Impression Seizure-like activity-PNS versus true seizures versus benzodiazepine withdrawal seizures versus opiate withdrawal   Recommendations: I would hold off on antiepileptics for now Stat EEG unremarkable-LTM EEG for spell characterization Ideally I would also like to avoid using benzos but if the concern is for benzodiazepine withdrawal, she will need to be on a protocol for the benzo withdrawal.  Management of benzo and opiate withdrawal per primary teams.  If possible to avoid benzodiazepine, that would be ideal to so as to not occlude the EEG results. I am hoping that we will capture some spells and we  will be better able to characterize her seizure-like activity and provide further recommendations based off of that. Plan was discussed with Dr. Ronnald Nian in the ER   -- Amie Portland, MD Neurologist [facility] Pager: [phone]  [place] Per [facility] statutes, patients with seizures are not allowed to drive until they have been seizure-free for six months.   Use caution when using heavy equipment or power tools. Avoid working  on ladders or at heights. Take showers instead of baths. Ensure the water temperature is not too high on the home water heater. Do not go swimming alone. Do not lock yourself in a room alone (i.e. bathroom). When caring for infants or small children, sit down when holding, feeding, or changing them to minimize risk of injury to the child in the event you have a seizure. Maintain good sleep hygiene. Avoid alcohol.    If patient has another seizure, call 911 and bring them back to the ED if: A.  The seizure lasts longer than 5 minutes.      B.  The patient doesn't wake shortly after the seizure or has new problems such as difficulty seeing, speaking or moving following the seizure C.  The patient was injured during the seizure D.  The patient has a temperature over 102 F (39C) E.  The patient vomited during the seizure and now is having trouble breathing

## 2022-07-21 NOTE — Progress Notes (Signed)
LTM EEG hooked up and running - no initial skin breakdown - push button tested - Pt is in ED with armed Police and Security. [facility] is NOT monitoring in ED

## 2022-07-22 ENCOUNTER — Observation Stay (HOSPITAL_COMMUNITY)

## 2022-07-22 DIAGNOSIS — R569 Unspecified convulsions: Secondary | ICD-10-CM | POA: Diagnosis not present

## 2022-07-22 LAB — CBC
HCT: 32.2 % — ABNORMAL LOW (ref 36.0–46.0)
Hemoglobin: 9.7 g/dL — ABNORMAL LOW (ref 12.0–15.0)
MCH: 24.9 pg — ABNORMAL LOW (ref 26.0–34.0)
MCHC: 30.1 g/dL (ref 30.0–36.0)
MCV: 82.8 fL (ref 80.0–100.0)
Platelets: 424 10*3/uL — ABNORMAL HIGH (ref 150–400)
RBC: 3.89 MIL/uL (ref 3.87–5.11)
RDW: 14.8 % (ref 11.5–15.5)
WBC: 8.5 10*3/uL (ref 4.0–10.5)
nRBC: 0 % (ref 0.0–0.2)

## 2022-07-22 LAB — RAPID URINE DRUG SCREEN, HOSP PERFORMED
Amphetamines: NOT DETECTED
Barbiturates: NOT DETECTED
Benzodiazepines: POSITIVE — AB
Cocaine: POSITIVE — AB
Opiates: NOT DETECTED
Tetrahydrocannabinol: NOT DETECTED

## 2022-07-22 LAB — HIV ANTIBODY (ROUTINE TESTING W REFLEX): HIV Screen 4th Generation wRfx: NONREACTIVE

## 2022-07-22 LAB — BASIC METABOLIC PANEL
Anion gap: 10 (ref 5–15)
BUN: 8 mg/dL (ref 6–20)
CO2: 20 mmol/L — ABNORMAL LOW (ref 22–32)
Calcium: 8.7 mg/dL — ABNORMAL LOW (ref 8.9–10.3)
Chloride: 107 mmol/L (ref 98–111)
Creatinine, Ser: 0.61 mg/dL (ref 0.44–1.00)
GFR, Estimated: >60 mL/min (ref >60)
Glucose, Bld: 172 mg/dL — ABNORMAL HIGH (ref 70–99)
Potassium: 3.5 mmol/L (ref 3.5–5.1)
Sodium: 137 mmol/L (ref 135–145)

## 2022-07-22 MED ORDER — CLONIDINE HCL 0.1 MG PO TABS: 0.1000 mg | ORAL_TABLET | Freq: Every day | ORAL | 0 refills | Status: DC

## 2022-07-22 MED ORDER — CLONIDINE HCL 0.1 MG PO TABS: 0.1000 mg | ORAL_TABLET | Freq: Every day | ORAL | 0 refills | Status: AC

## 2022-07-22 MED ORDER — CLONIDINE HCL 0.1 MG PO TABS: 0.1000 mg | ORAL_TABLET | Freq: Two times a day (BID) | ORAL | 0 refills | Status: DC

## 2022-07-22 MED ORDER — HYDROXYZINE HCL 25 MG PO TABS: 25.0000 mg | ORAL_TABLET | Freq: Four times a day (QID) | ORAL | 0 refills | Status: DC | PRN

## 2022-07-22 MED ORDER — ONDANSETRON HCL 4 MG PO TABS: 4.0000 mg | ORAL_TABLET | Freq: Every day | ORAL | 1 refills | Status: AC | PRN

## 2022-07-22 MED ORDER — LOPERAMIDE HCL 2 MG PO TABS: 2.0000 mg | ORAL_TABLET | Freq: Four times a day (QID) | ORAL | 0 refills | Status: DC | PRN

## 2022-07-22 MED ORDER — LOPERAMIDE HCL 2 MG PO TABS: 2.0000 mg | ORAL_TABLET | Freq: Four times a day (QID) | ORAL | 0 refills | Status: AC | PRN

## 2022-07-22 MED ORDER — ONDANSETRON HCL 4 MG PO TABS: 4.0000 mg | ORAL_TABLET | Freq: Every day | ORAL | 1 refills | Status: DC | PRN

## 2022-07-22 MED ORDER — CLONIDINE HCL 0.1 MG PO TABS: 0.1000 mg | ORAL_TABLET | Freq: Two times a day (BID) | ORAL | 0 refills | Status: AC

## 2022-07-22 MED ORDER — CLONIDINE HCL 0.1 MG PO TABS: 0.1000 mg | ORAL_TABLET | Freq: Four times a day (QID) | ORAL | 0 refills | Status: AC

## 2022-07-22 MED ORDER — CLONIDINE HCL 0.1 MG PO TABS: 0.1000 mg | ORAL_TABLET | Freq: Four times a day (QID) | ORAL | 0 refills | Status: DC

## 2022-07-22 MED ORDER — HYDROXYZINE HCL 25 MG PO TABS: 25.0000 mg | ORAL_TABLET | Freq: Four times a day (QID) | ORAL | 0 refills | Status: AC | PRN
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
## 2022-07-22 NOTE — Progress Notes (Signed)
Performed maintenance on Cz F8, Fz, and P3.  All under 10
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
## 2022-07-22 NOTE — Progress Notes (Signed)
LTM maint complete - no skin breakdown .  Patient has been moved to 5w, [facility] monitored, Event button test confirmed by [facility].

## 2022-07-22 NOTE — Procedures (Addendum)
Patient Name: Jenny Reed  MRN: [id number]  Epilepsy Attending: Charlsie Quest  Referring Physician/Provider: Milon Dikes, MD  Duration: 07/21/2022 1559 to 07/22/2022 1401   Patient history: 31 year old female with seizure-like activity.  EEG to evaluate for seizure.   Level of alertness: Awake, asleep   AEDs during EEG study: Ativan    Technical aspects: This EEG study was done with scalp electrodes positioned according to the 10-20 International system of electrode placement. Electrical activity was reviewed with band pass filter of 1-70Hz , sensitivity of 7 uV/mm, display speed of 34mm/sec with a 60Hz  notched filter applied as appropriate. EEG data were recorded continuously and digitally stored. Video monitoring was available and reviewed as appropriate.   Description: No clear posterior dominant rhythm was seen. Sleep was characterized by vertex waves, sleep spindles (12 to 14 Hz), maximal frontocentral region. There is an excessive amount of 15 to 18 Hz beta activity distributed symmetrically and diffusely. Hyperventilation and photic stimulation were not performed.      ABNORMALITY - Excessive beta, generalized   IMPRESSION: This study is within normal limits. The excessive beta activity seen in the background is most likely due to the effect of benzodiazepine and is a benign EEG pattern. No seizures or epileptiform discharges were seen throughout the recording.   Jahmier Willadsen 

## 2022-07-22 NOTE — Plan of Care (Signed)
  Problem: Health Behavior/Discharge Planning: Goal: Ability to manage health-related needs will improve Outcome: Progressing   Problem: Clinical Measurements: Goal: Ability to maintain clinical measurements within normal limits will improve Outcome: Progressing   Problem: Coping: Goal: Level of anxiety will decrease Outcome: Progressing   Problem: Pain Managment: Goal: General experience of comfort will improve Outcome: Progressing   
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
## 2022-07-22 NOTE — Progress Notes (Signed)
Neurology Progress Note  Brief HPI: 31 y.o. female past medical history of polysubstance abuse, on chronic Xanax, who has been incarcerated since 12/25, coming into the emergency room for multiple seizures at the facility and then multiple seizures over in the emergency room.  No prior history of head injury. Initially left AMA from [facility] and then came back to [facility] ED with more seizure activity. During first ED visit she received multiple doses of ativan and on second visit she received 0.5mg  of ativan and 2 doses of Geodon.   Semiology described as head arching back with generalized shaking and then coming to without any amount of postictal. She reports that she has been having seizures for a few months in the setting of benzodiazepine withdrawal.  Never had seizures as a child.    Subjective: Patient seen in room. She is jittery and reports all over pain.  She tells [place] that she started using xanax 7 years ago at 1mg  per day but has progressively increased her usage to 4mg  daily over the last month.   Exam: Vitals:   07/22/22 0000 07/22/22 0300  BP: 108/69 (!) 102/59  Pulse: 99 92  Resp:  17  Temp:  98.7 F (37.1 C)  SpO2:  97%   Gen: In bed, appear agitated  Resp: non-labored breathing, no acute respiratory distress Abd: soft, nt  Neuro: Mental Status: AAox4. Speech is clear. Patient is able to give a clear and coherent history. No signs of aphasia or neglect Cranial Nerves: II: Visual Fields are full. PERRL.   III,IV, VI: EOMI without ptosis or diploplia.  V: Facial sensation is symmetric to temperature VII: Facial movement is symmetric resting and smiling VIII: Hearing is intact to voice X: Palate elevates symmetrically XI: Shoulder shrug is symmetric. XII: Tongue protrudes midline without atrophy or fasciculations.  Motor: Tone is normal. Bulk is normal. 5/5 strength was present in all four extremities.  Sensory: Sensation is symmetric to light touch   Cerebellar: Generalized tremors/shakiness  Gait: deferred for safety    Pertinent Labs: Positive for cocaine, benzodiazepines  Imaging Reviewed: CT Head- Negative CT of the head.   07/21/2022 1559 to 07/22/2022 0800 - This study is within normal limits. The excessive beta activity seen in the background is most likely due to the effect of benzodiazepine and is a benign EEG pattern. No seizures or epileptiform discharges were seen throughout the recording.   Assessment:  Received 0.5mg  of ativan and geodon in the ED during this admission.  She is currently receiving clonidine 0.1mg  QID  Impression:  Seizure-like activity-PNS versus true seizures versus benzodiazepine withdrawal seizures versus opiate withdrawal   Recommendations: Stat EEG unremarkable-LTM EEG for spell characterization Ideally I would also like to avoid using benzos but if the concern is for benzodiazepine withdrawal, she will need to be on a protocol for the benzo withdrawal.   Management of benzo and opiate withdrawal per primary teams. If possible to avoid benzodiazepine, that would be ideal to so as to not occlude the EEG results.  SEIZURE PRECAUTIONS Per [place] statutes, patients with seizures are not allowed to drive until they have been seizure-free for six months.    Use caution when using heavy equipment or power tools. Avoid working on ladders or at heights. Take showers instead of baths. Ensure the water temperature is not too high on the home water heater. Do not go swimming alone. Do not lock yourself in a room alone (i.e. bathroom). When caring for infants  or small children, sit down when holding, feeding, or changing them to minimize risk of injury to the child in the event you have a seizure. Maintain good sleep hygiene. Avoid alcohol.    If patient has another seizure, call 911 and bring them back to the ED if: A.  The seizure lasts longer than 5 minutes.      B.  The patient doesn't  wake shortly after the seizure or has new problems such as difficulty seeing, speaking or moving following the seizure C.  The patient was injured during the seizure D.  The patient has a temperature over 102 F (39C) E.  The patient vomited during the seizure and now is having trouble breathing      Patient seen and examined by NP/APP with MD. MD to update note as needed.   Elmer Picker, DNP, FNP-BC [facility] Pager: [phone]   Attending addendum Patient seen and examined No focal deficits Captured spells on EEG with no electrographic correlate Admits to attempting to fix seizures to get out of jail and to grab attention. She does have a lot of psychosocial issues which need to be addressed with social work. No need for antiepileptics Management of benzo withdrawal as per primary team Plan was discussed with Dr. Natale Milch -- Milon Dikes, MD Neurologist [facility] Pager: [phone]

## 2022-07-22 NOTE — Discharge Summary (Signed)
Physician Discharge Summary  Jenny Reed NWG:[id number] DOB: 15-Aug-1990 DOA: 07/21/2022  PCP: Oneita Hurt, No  Admit date: 07/21/2022 Discharge date: 07/22/2022  Admitted From: [address] Disposition:  Same  Recommendations for Outpatient Follow-up:  Follow up with PCP in 1-2 weeks Follow up with Neuro as scheduled  Discharge Condition:Stable  CODE STATUS:Full  Diet recommendation: As tolerated   Brief/Interim Summary: AMARRIA ANDREASEN is a 31 y.o. female with medical history significant of IVDA, reports diagnosis of bipolar disorder and questionable history of seizure-like activity but no formal diagnosis of seizure.  She presents with seizure-like activity from jail having reportedly recently discontinued heroin cocaine and benzodiazepine abuse. She does not desire to quit at this time.  She previously tried suboxone/methadone and does not wish to use this again. She was incarcerated 2-3 days prior to admission and had access to these medications just prior to incarceration.   Of note she was seen at [facility] the day prior to admission for upwards of 10 episodes of seizure-like activity within 90 minutes with immediate return to baseline mental status each time.  She had a separate event in the ED lasting 10 seconds and was alert oriented immediately after this event. She was allowed to sign out AMA but was notified that she would be returned to the ER should additional seizure activity take place. After her return to the jail, she had another seizure-like episode and was brought back to the ER.  She had another episode while she was in the ER overnight with spontaneous return to baseline mental status.  Patient admitted as above for seizure rule out, long-term EEG performed, neurology consulted.  Appreciate their insight and recommendations.  Today at bedside patient had thrown herself from bed while handcuffed to the bed onto the floor stating "I did it for attention" she also indicates that  she has been "faking seizures for attention" to the nursing staff.  See their documentation earlier today.  At this time given patient's negative imaging, negative EEG and overt confession that these are fake episodes we will discharge patient back to jail with a diagnosis of pseudoseizure/conversion disorder.  Patient certainly would benefit from follow-up with PCP in the outpatient setting given her chronic and ongoing illicit substance abuse we did discuss the benefits of Suboxone and methadone treatment over obtaining medication over the street versus illicit substances.  She indicates she has no interest in quitting her illicit substance abuse, as such she will not be prescribed Suboxone or methadone given her unlikely compliance.  She is otherwise stable and agreeable for discharge back to jail.  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    Bactrim [sulfamethoxazole-trimethoprim] Hives, Swelling        Medication List     TAKE these medications    acetaminophen 500 MG tablet Commonly known as: TYLENOL Take 1,000 mg by mouth every 6 (six) hours as needed for moderate pain.   cloNIDine 0.1 MG tablet Commonly known as: CATAPRES Take 1 tablet (0.1 mg total) by mouth 4 (four) times daily for 3 days.   cloNIDine 0.1 MG tablet Commonly known as: CATAPRES Take 1 tablet (0.1 mg total) by mouth 2 (two) times daily for 3 days. Start taking on: July 25, 2022   cloNIDine 0.1 MG  tablet Commonly known as: CATAPRES Take 1 tablet (0.1 mg total) by mouth daily for 3 days. Start taking on: July 29, 2022   hydrOXYzine 25 MG tablet Commonly known as: ATARAX Take 1 tablet (25 mg total) by mouth every 6 (six) hours as needed for anxiety.   loperamide 2 MG  tablet Commonly known as: Imodium A-D Take 1 tablet (2 mg total) by mouth 4 (four) times daily as needed for diarrhea or loose stools.   ondansetron 4 MG tablet Commonly known as: Zofran Take 1 tablet (4 mg total) by mouth daily as needed for nausea or vomiting.        Allergies  Allergen Reactions   Aspirin Other (See Comments)    Pt states that this medication causes stomach pain.     Bactrim [Sulfamethoxazole-Trimethoprim] Hives and Swelling    Consultations: Neuro  Procedures/Studies: Overnight EEG with video  Result Date: 07/22/2022 Charlsie QuestYadav, Priyanka O, MD     07/22/2022  8:11 AM Patient Name: Jenny Reed MRN: [id number] Epilepsy Attending: Charlsie QuestPriyanka O Yadav Referring Physician/Provider: Milon DikesArora, Ashish, MD Duration: 07/21/2022 1559 to 07/22/2022 0800  Patient history: 31 year old female with seizure-like activity.  EEG to evaluate for seizure.  Level of alertness: Awake, asleep  AEDs during EEG study: Ativan  Technical aspects: This EEG study was done with scalp electrodes positioned according to the 10-20 International system of electrode placement. Electrical activity was reviewed with band pass filter of 1-70Hz , sensitivity of 7 uV/mm, display speed of 4830mm/sec with a 60Hz  notched filter applied as appropriate. EEG data were recorded continuously and digitally stored. Video monitoring was available and reviewed as appropriate.  Description: No clear posterior dominant rhythm was seen. Sleep was characterized by vertex waves, sleep spindles (12 to 14 Hz), maximal frontocentral region. There is an excessive amount of 15 to 18 Hz beta activity distributed symmetrically and diffusely. Hyperventilation and photic stimulation were not performed.    ABNORMALITY - Excessive beta, generalized  IMPRESSION: This study is within normal limits. The excessive beta activity seen in the background is most likely due to the effect of benzodiazepine and is a benign EEG pattern. No seizures or  epileptiform discharges were seen throughout the recording.  Charlsie Questriyanka O Yadav   CT Head Wo Contrast  Result Date: 07/21/2022 CLINICAL DATA:  Mental status change.  Seizure. EXAM: CT HEAD WITHOUT CONTRAST TECHNIQUE: Contiguous axial images were obtained from the base of the skull through the vertex without intravenous contrast. RADIATION DOSE REDUCTION: This exam was performed according to the departmental dose-optimization program which includes automated exposure control, adjustment of the mA and/or kV according to patient size and/or use of iterative reconstruction technique. COMPARISON:  [facility] head without contrast [facility] FINDINGS: Brain: No acute infarct, hemorrhage, or mass lesion is present. No significant white matter lesions are present. Deep brain nuclei are within normal limits. The ventricles are of normal size. No significant extraaxial fluid collection is present. The brainstem and cerebellum are within normal limits. Vascular: No hyperdense vessel or unexpected calcification. Skull: Calvarium is intact. No focal lytic or blastic lesions are present. No significant extracranial soft tissue lesion is present. Sinuses/Orbits: The paranasal sinuses and mastoid air cells are clear. The globes and orbits are within normal limits. IMPRESSION: Negative CT of the head. Electronically Signed   By: Marin Robertshristopher  Mattern M.D.   On: 07/21/2022 15:22   EEG adult  Result Date: 07/21/2022 Charlsie QuestYadav, Priyanka O, MD     07/21/2022  2:46 PM Patient Name:  Jenny Reed MRN: [id number] Epilepsy Attending: Charlsie Quest Referring Physician/Provider: Virgina Norfolk, DO Date: 07/21/2022 Duration: 23.17 mins Patient history: 31 year old female with seizure-like activity.  EEG to evaluate for seizure. Level of alertness: Awake, asleep AEDs during EEG study: Ativan Technical aspects: This EEG study was done with scalp electrodes positioned according to the 10-20 International system of electrode  placement. Electrical activity was reviewed with band pass filter of 1-70Hz , sensitivity of 7 uV/mm, display speed of 47mm/sec with a 60Hz  notched filter applied as appropriate. EEG data were recorded continuously and digitally stored.  Video monitoring was available and reviewed as appropriate. Description: No clear posterior dominant rhythm was seen. Sleep was characterized by vertex waves, sleep spindles (12 to 14 Hz), maximal frontocentral region. There is an excessive amount of 15 to 18 Hz beta activity distributed symmetrically and diffusely. Hyperventilation and photic stimulation were not performed.   ABNORMALITY - Excessive beta, generalized IMPRESSION: This study is within normal limits. The excessive beta activity seen in the background is most likely due to the effect of benzodiazepine and is a benign EEG pattern. No seizures or epileptiform discharges were seen throughout the recording. Priyanka     Subjective: No acute issues/events overnight   Discharge Exam: Vitals:   07/22/22 0930 07/22/22 1000  BP: 109/72 112/67  Pulse:  83  Resp:  18  Temp:    SpO2:     Vitals:   07/22/22 0000 07/22/22 0300 07/22/22 0930 07/22/22 1000  BP: 108/69 (!) 102/59 109/72 112/67  Pulse: 99 92  83  Resp:  17  18  Temp:  98.7 F (37.1 C)    TempSrc:    Oral  SpO2:  97%    Weight:        General: Pt is alert, awake, not in acute distress Cardiovascular: RRR, S1/S2 +, no rubs, no gallops Respiratory: CTA bilaterally, no wheezing, no rhonchi Abdominal: Soft, NT, ND, bowel sounds + Extremities: no edema, no cyanosis  The results of significant diagnostics from this hospitalization (including imaging, microbiology, ancillary and laboratory) are listed below for reference.     Microbiology: No results found for this or any previous visit (from the past 240 hour(s)).   Labs: BNP (last 3 results) No results for input(s): "BNP" in the last 8760 hours. Basic Metabolic Panel: Recent  Labs  Lab 07/20/22 1424 07/21/22 0116 07/22/22 0026  NA 138 139 137  K 3.8 3.6 3.5  CL 109 110 107  CO2 23 25 20*  GLUCOSE 107* 126* 172*  BUN 10 11 8   CREATININE 0.42* 0.61 0.61  CALCIUM 8.5* 9.0 8.7*  MG  --  1.9  --    Liver Function Tests: Recent Labs  Lab 07/20/22 1424  AST 131*  ALT 96*  ALKPHOS 73  BILITOT 0.4  PROT 6.9  ALBUMIN 3.6   No results for input(s): "LIPASE", "AMYLASE" in the last 168 hours. No results for input(s): "AMMONIA" in the last 168 hours. CBC: Recent Labs  Lab 07/20/22 1424 07/21/22 0116 07/22/22 0026  WBC 8.7 8.5 8.5  NEUTROABS 6.0 5.8  --   HGB 11.1* 10.3* 9.7*  HCT 37.5 34.4* 32.2*  MCV 84.1 84.5 82.8  PLT 429* 394 424*   Cardiac Enzymes: No results for input(s): "CKTOTAL", "CKMB", "CKMBINDEX", "TROPONINI" in the last 168 hours. BNP: Invalid input(s): "POCBNP" CBG: Recent Labs  Lab 07/20/22 1351 07/20/22 1650  GLUCAP 109* 106*   Urinalysis    Component Value Date/Time  COLORURINE YELLOW 04/26/2015 1424   APPEARANCEUR CLOUDY (A) 04/26/2015 1424   LABSPEC 1.016 04/26/2015 1424   PHURINE 7.0 04/26/2015 1424   GLUCOSEU NEGATIVE 04/26/2015 1424   HGBUR NEGATIVE 04/26/2015 1424   BILIRUBINUR NEGATIVE 04/26/2015 1424   KETONESUR NEGATIVE 04/26/2015 1424   PROTEINUR NEGATIVE 04/26/2015 1424   UROBILINOGEN 1.0 04/26/2015 1424   NITRITE NEGATIVE 04/26/2015 1424   LEUKOCYTESUR NEGATIVE 04/26/2015 1424   Sepsis Labs Recent Labs  Lab 07/20/22 1424 07/21/22 0116 07/22/22 0026  WBC 8.7 8.5 8.5   Microbiology No results found for this or any previous visit (from the past 240 hour(s)).   Time coordinating discharge: Over 30 minutes  SIGNED:   Azucena Fallen, DO [facility] 07/22/2022, 1:27 PM Pager   If 7PM-7AM, please contact night-coverage [email or web address]

## 2022-07-22 NOTE — Progress Notes (Signed)
   07/22/22 1400  What Happened  Was fall witnessed? Yes  Who witnessed fall? police officer at bedside  Patients activity before fall other (comment) (laying in bed)  Point of contact hip/leg;other (comment) (right ribcage)  Was patient injured? Unsure  Provider Notification  Provider Name/Title Dr. Natale Milch  Date Provider Notified 07/22/22  Time Provider Notified 1315  Method of Notification Call  Notification Reason Fall  Provider response See new orders  Date of Provider Response 07/22/22  Time of Provider Response 1315  Follow Up  Family notified No - patient refusal  Simple treatment Other (comment) (Tylenol)  Progress note created (see row info) Yes  Blank note created Yes  Adult Fall Risk Assessment  Risk Factor Category (scoring not indicated) Fall has occurred during this admission (document High fall risk)  Age 31  Fall History: Fall within 6 months prior to admission 5  Elimination; Bowel and/or Urine Incontinence 2  Elimination; Bowel and/or Urine Urgency/Frequency 0  Medications: includes PCA/Opiates, Anti-convulsants, Anti-hypertensives, Diuretics, Hypnotics, Laxatives, Sedatives, and Psychotropics 3  Patient Care Equipment 0  Mobility-Assistance 2  Mobility-Gait 0  Mobility-Sensory Deficit 0  Altered awareness of immediate physical environment 0  Impulsiveness 0  Lack of understanding of one's physical/cognitive limitations 0  Total Score 12  Patient Fall Risk Level High fall risk  Adult Fall Risk Interventions  Required Bundle Interventions *See Row Information* High fall risk - low, moderate, and high requirements implemented  Additional Interventions Use of appropriate toileting equipment (bedpan, BSC, etc.);Safety Sitter/Safety Rounder  Screening for Fall Injury Risk (To be completed on HIGH fall risk patients) - Assessing Need for Floor Mats  Risk For Fall Injury- Criteria for Floor Mats Previous fall this admission  Will Implement Floor Mats Yes
